# Patient Record
Sex: Male | Born: 1965 | Hispanic: Yes | Marital: Married | Smoking: Never smoker
Health system: Southern US, Community
[De-identification: ages and names within clinical notes are randomized; demographics above are authoritative.]

## PROBLEM LIST (undated history)

## (undated) DIAGNOSIS — I10 Essential (primary) hypertension: Secondary | ICD-10-CM

---

## 2018-12-17 ENCOUNTER — Emergency Department (HOSPITAL_COMMUNITY): Payer: Medicaid Other

## 2018-12-17 ENCOUNTER — Other Ambulatory Visit: Payer: Self-pay

## 2018-12-17 ENCOUNTER — Emergency Department (HOSPITAL_COMMUNITY)
Admission: EM | Admit: 2018-12-17 | Discharge: 2018-12-17 | Disposition: A | Discharge status: Home / Self Care | Payer: Medicaid Other | Attending: Emergency Medicine | Admitting: Emergency Medicine

## 2018-12-17 DIAGNOSIS — J22 Unspecified acute lower respiratory infection: Secondary | ICD-10-CM | POA: Insufficient documentation

## 2018-12-17 DIAGNOSIS — U071 COVID-19: Secondary | ICD-10-CM | POA: Diagnosis not present

## 2018-12-17 DIAGNOSIS — R0602 Shortness of breath: Secondary | ICD-10-CM | POA: Diagnosis present

## 2018-12-17 LAB — CBC WITH DIFFERENTIAL/PLATELET
Abs Immature Granulocytes: 0.04 10*3/uL (ref 0.00–0.07)
Basophils Absolute: 0 10*3/uL (ref 0.0–0.1)
Basophils Relative: 0 %
Eosinophils Absolute: 0 10*3/uL (ref 0.0–0.5)
Eosinophils Relative: 0 %
HCT: 49.2 % (ref 39.0–52.0)
Hemoglobin: 16.5 g/dL (ref 13.0–17.0)
Immature Granulocytes: 1 %
Lymphocytes Relative: 12 %
Lymphs Abs: 0.8 10*3/uL (ref 0.7–4.0)
MCH: 30.4 pg (ref 26.0–34.0)
MCHC: 33.5 g/dL (ref 30.0–36.0)
MCV: 90.8 fL (ref 80.0–100.0)
Monocytes Absolute: 0.8 10*3/uL (ref 0.1–1.0)
Monocytes Relative: 12 %
Neutro Abs: 5.3 10*3/uL (ref 1.7–7.7)
Neutrophils Relative %: 75 %
Platelets: 219 10*3/uL (ref 150–400)
RBC: 5.42 MIL/uL (ref 4.22–5.81)
RDW: 13.1 % (ref 11.5–15.5)
WBC: 7.1 10*3/uL (ref 4.0–10.5)
nRBC: 0 % (ref 0.0–0.2)

## 2018-12-17 LAB — COMPREHENSIVE METABOLIC PANEL
ALT: 32 U/L (ref 0–44)
AST: 39 U/L (ref 15–41)
Albumin: 3.4 g/dL — ABNORMAL LOW (ref 3.5–5.0)
Alkaline Phosphatase: 112 U/L (ref 38–126)
Anion gap: 10 (ref 5–15)
BUN: 15 mg/dL (ref 6–20)
CO2: 25 mmol/L (ref 22–32)
Calcium: 8.3 mg/dL — ABNORMAL LOW (ref 8.9–10.3)
Chloride: 98 mmol/L (ref 98–111)
Creatinine, Ser: 0.65 mg/dL (ref 0.61–1.24)
GFR calc Af Amer: >60 mL/min (ref >60)
GFR calc non Af Amer: >60 mL/min (ref >60)
Glucose, Bld: 159 mg/dL — ABNORMAL HIGH (ref 70–99)
Potassium: 3.8 mmol/L (ref 3.5–5.1)
Sodium: 133 mmol/L — ABNORMAL LOW (ref 135–145)
Total Bilirubin: 0.9 mg/dL (ref 0.3–1.2)
Total Protein: 7 g/dL (ref 6.5–8.1)

## 2018-12-17 LAB — SARS CORONAVIRUS 2 BY RT PCR (HOSPITAL ORDER, PERFORMED IN ~~LOC~~ HOSPITAL LAB): SARS Coronavirus 2: POSITIVE — AB

## 2018-12-17 LAB — FIBRINOGEN: Fibrinogen: 676 mg/dL — ABNORMAL HIGH (ref 210–475)

## 2018-12-17 LAB — LACTIC ACID, PLASMA: Lactic Acid, Venous: 1 mmol/L (ref 0.5–1.9)

## 2018-12-17 LAB — C-REACTIVE PROTEIN: CRP: 5.9 mg/dL — ABNORMAL HIGH (ref <1.0)

## 2018-12-17 MED ORDER — KETOROLAC TROMETHAMINE 30 MG/ML IJ SOLN
30.0000 mg | Freq: Once | INTRAMUSCULAR | Status: AC
  Administered 2018-12-17: 10:00:00 30 mg via INTRAVENOUS
  Filled 2018-12-17: qty 1

## 2018-12-17 MED ORDER — ONDANSETRON HCL 4 MG/2ML IJ SOLN
4.0000 mg | Freq: Once | INTRAMUSCULAR | Status: AC
  Administered 2018-12-17: 10:00:00 4 mg via INTRAVENOUS
  Filled 2018-12-17: qty 2

## 2018-12-17 MED ORDER — SODIUM CHLORIDE 0.9 % IV BOLUS
1000.0000 mL | Freq: Once | INTRAVENOUS | Status: AC
  Administered 2018-12-17: 1000 mL via INTRAVENOUS

## 2018-12-17 MED ORDER — ALBUTEROL SULFATE HFA 108 (90 BASE) MCG/ACT IN AERS
2.0000 | INHALATION_SPRAY | RESPIRATORY_TRACT | Status: DC
  Administered 2018-12-17: 10:00:00 2 via RESPIRATORY_TRACT
  Filled 2018-12-17: qty 6.7

## 2018-12-17 MED ORDER — LEVOFLOXACIN 500 MG PO TABS: 500.0000 mg | ORAL_TABLET | Freq: Every day | ORAL | 0 refills | Status: DC

## 2018-12-17 MED ORDER — LEVOFLOXACIN 500 MG PO TABS
500.0000 mg | ORAL_TABLET | Freq: Once | ORAL | Status: AC
  Administered 2018-12-17: 12:00:00 500 mg via ORAL
  Filled 2018-12-17: qty 1

## 2018-12-17 NOTE — ED Notes (Signed)
Pt ambulatory at discharge, provided with taxi voucher.

## 2018-12-17 NOTE — ED Provider Notes (Signed)
[facility]-EMERGENCY DEPT Provider Note   CSN: [id number] Arrival date & time: 12/17/18  0848    History   Chief Complaint No chief complaint on file.   HPI Cameron Hampton is a 53 y.o. male.     HPI Patient is a 53 year old male whose spouse is tested positive for COVID-19.  He states he is felt short of breath over the past several days with ongoing cough.  Fevers at home.  Headache.  Myalgias.  Diarrhea.  Denies nausea and vomiting.  No history of COPD or asthma.  No use of bronchodilators to this point.  Reports decreased oral intake.  He is tried ibuprofen without improvement in symptoms.  His cough is dry.  His wife is in the emergency department with similar symptoms despite testing positive over 1 week ago.  There is a 55 year old who lives with the patient as well but she is currently okay without upper respiratory symptoms. No past medical history on file.  There are no active problems to display for this patient.   * The histories are not reviewed yet. Please review them in the "History" navigator section and refresh this SmartLink.      Home Medications    Prior to Admission medications   Not on File    Family History No family history on file.  Social History Social History   Tobacco Use  . Smoking status: Not on file  Substance Use Topics  . Alcohol use: Not on file  . Drug use: Not on file     Allergies   Patient has no allergy information on record.   Review of Systems Review of Systems  All other systems reviewed and are negative.    Physical Exam Updated Vital Signs BP (!) 127/94 (BP Location: Left Arm)   Pulse 67   Temp 99.3 F (37.4 C) (Oral)   Resp 18   Ht 6' (1.829 m)   Wt 87.1 kg   SpO2 97%   BMI 26.04 kg/m   Physical Exam Vitals signs and nursing note reviewed.  Constitutional:      Appearance: Normal appearance. He is well-developed and normal weight. He is not ill-appearing, toxic-appearing or  diaphoretic.  HENT:     Head: Normocephalic and atraumatic.  Neck:     Musculoskeletal: Normal range of motion.  Cardiovascular:     Rate and Rhythm: Normal rate and regular rhythm.     Heart sounds: Normal heart sounds.  Pulmonary:     Effort: Pulmonary effort is normal. No respiratory distress.     Breath sounds: Normal breath sounds. No stridor. No wheezing or rhonchi.     Comments: No respiratory distress Abdominal:     General: There is no distension.     Palpations: Abdomen is soft.     Tenderness: There is no abdominal tenderness.  Musculoskeletal: Normal range of motion.  Skin:    General: Skin is warm and dry.  Neurological:     Mental Status: He is alert and oriented to person, place, and time.  Psychiatric:        Judgment: Judgment normal.      ED Treatments / Results  Labs (all labs ordered are listed, but only abnormal results are displayed) Labs Reviewed - No data to display  EKG None  Radiology No results found.  Procedures Procedures (including critical care time)  Medications Ordered in ED Medications - No data to display   Initial Impression / Assessment and Plan / ED  Course  I have reviewed the triage vital signs and the nursing notes.  Pertinent labs & imaging results that were available during my care of the patient were reviewed by me and considered in my medical decision making (see chart for details).       Symptomatic management here in the emergency department.  Patient was tested for coronavirus and he is positive for COVID-19.  He does have an infiltrate in the left lower lung.  He will be started on Levaquin.  He is at risk for worsening of his symptoms but he was observed in the emergency department for over 7 hours without decompensation.  He was ambulated throughout his room a total of approximately 50 feet without increased work of breathing or hypoxia.  At this time he is stable for discharge.  Hopefully he will improve at home  with symptomatic management.  He has been encouraged to return to the emergency department for new or worsening symptoms.  He understands the possibility of worsening of his symptoms given the clinical course of COVID-19.  All discharge instructions were provided to the patient using the Spanish interpreter.  He understands the importance of home quarantine and self isolation from his daughter who lives in the house with them as well as other family members.  He is not to be in public   Cameron Hampton was evaluated in Emergency Department on 12/17/2018 for the symptoms described in the history of present illness. He was evaluated in the context of the global COVID-19 pandemic, which necessitated consideration that the patient might be at risk for infection with the SARS-CoV-2 virus that causes COVID-19. Institutional protocols and algorithms that pertain to the evaluation of patients at risk for COVID-19 are in a state of rapid change based on information released by regulatory bodies including the CDC and federal and state organizations. These policies and algorithms were followed during the patient's care in the ED.   Final Clinical Impressions(s) / ED Diagnoses   Final diagnoses:  COVID-19 virus infection  Lower respiratory tract infection due to COVID-19 virus    ED Discharge Orders    None       Azalia Bilis, MD 12/17/18 1554

## 2018-12-17 NOTE — Progress Notes (Signed)
Voucher provided to RN.  Please reconsult if future social work needs arise.  CSW signing off, as social work intervention is no longer needed.  Dorothe Pea. Danahi Reddish, LCSW, LCAS, CSI Transitions of Care Clinical Social Worker Care Coordination Department Ph: [phone]

## 2018-12-17 NOTE — ED Notes (Signed)
Discharge paperwork reviewed with pt using [facility] interpreter.  Pt verbalized understanding of information and included prescriptions.  Pt expressed need for taxi voucher for transportation home.  SW notified.

## 2018-12-17 NOTE — ED Triage Notes (Signed)
Information obtained using [facility] interpreter.    Wife confirmed positive with Covid-19   Fever, headache, sore throat and chest pain d/t cough x10 days. Also c/o generalized weakness. Pt symptomatic fever, no thermometer at home. Pt reports being unable to eat, constant nausea.  Pt with dry cough. Pt reports taking ibuprofen, 2 capsules, at appx 0400 today.  Hx of hypertension.

## 2018-12-17 NOTE — Progress Notes (Signed)
CSW received a call from pt's RN, both pt and spouse are covid positive and need transport home.  CSW to provide taxi voucher.  CSW will continue to follow for D/C needs.  Cameron Hampton. [name], LCSW, LCAS, CSI Transitions of Care Clinical Social Worker Care Coordination Department Ph: [phone]

## 2018-12-17 NOTE — ED Notes (Signed)
Date and time results received: 12/17/18 12:35 PM   Test: Covid Critical Value: positive  Name of Provider Notified:campos  Orders Received? Or Actions Taken?:

## 2018-12-17 NOTE — ED Notes (Signed)
Pt able to independently ambulate in room, appx 50 feet.  Pt O2 saturations did not go below 94% during or after walking.  Pt c/o increased SHOB, labored breathing.

## 2018-12-21 ENCOUNTER — Inpatient Hospital Stay (HOSPITAL_COMMUNITY)
Admission: EM | Admit: 2018-12-21 | Discharge: 2018-12-31 | DRG: 177 | Disposition: A | Discharge status: Home / Self Care | Payer: Medicaid Other | Attending: Internal Medicine | Admitting: Internal Medicine

## 2018-12-21 ENCOUNTER — Other Ambulatory Visit: Payer: Self-pay

## 2018-12-21 ENCOUNTER — Emergency Department (HOSPITAL_COMMUNITY): Payer: Medicaid Other

## 2018-12-21 DIAGNOSIS — J029 Acute pharyngitis, unspecified: Secondary | ICD-10-CM | POA: Diagnosis present

## 2018-12-21 DIAGNOSIS — R109 Unspecified abdominal pain: Secondary | ICD-10-CM

## 2018-12-21 DIAGNOSIS — R0602 Shortness of breath: Secondary | ICD-10-CM

## 2018-12-21 DIAGNOSIS — E1165 Type 2 diabetes mellitus with hyperglycemia: Secondary | ICD-10-CM | POA: Diagnosis present

## 2018-12-21 DIAGNOSIS — K851 Biliary acute pancreatitis without necrosis or infection: Secondary | ICD-10-CM | POA: Diagnosis present

## 2018-12-21 DIAGNOSIS — R001 Bradycardia, unspecified: Secondary | ICD-10-CM | POA: Diagnosis not present

## 2018-12-21 DIAGNOSIS — T380X5A Adverse effect of glucocorticoids and synthetic analogues, initial encounter: Secondary | ICD-10-CM | POA: Diagnosis present

## 2018-12-21 DIAGNOSIS — I472 Ventricular tachycardia: Secondary | ICD-10-CM | POA: Diagnosis not present

## 2018-12-21 DIAGNOSIS — Q676 Pectus excavatum: Secondary | ICD-10-CM

## 2018-12-21 DIAGNOSIS — J1289 Other viral pneumonia: Secondary | ICD-10-CM | POA: Diagnosis present

## 2018-12-21 DIAGNOSIS — I10 Essential (primary) hypertension: Secondary | ICD-10-CM | POA: Diagnosis present

## 2018-12-21 DIAGNOSIS — J9601 Acute respiratory failure with hypoxia: Secondary | ICD-10-CM | POA: Diagnosis present

## 2018-12-21 DIAGNOSIS — R7989 Other specified abnormal findings of blood chemistry: Secondary | ICD-10-CM

## 2018-12-21 DIAGNOSIS — Z8249 Family history of ischemic heart disease and other diseases of the circulatory system: Secondary | ICD-10-CM

## 2018-12-21 DIAGNOSIS — U071 COVID-19: Principal | ICD-10-CM | POA: Diagnosis present

## 2018-12-21 DIAGNOSIS — J069 Acute upper respiratory infection, unspecified: Secondary | ICD-10-CM | POA: Diagnosis present

## 2018-12-21 DIAGNOSIS — I712 Thoracic aortic aneurysm, without rupture: Secondary | ICD-10-CM | POA: Diagnosis present

## 2018-12-21 HISTORY — DX: Essential (primary) hypertension: I10

## 2018-12-21 LAB — LACTATE DEHYDROGENASE: LDH: 352 U/L — ABNORMAL HIGH (ref 98–192)

## 2018-12-21 LAB — LIPASE, BLOOD: Lipase: 37 U/L (ref 11–51)

## 2018-12-21 LAB — CBC WITH DIFFERENTIAL/PLATELET
Abs Immature Granulocytes: 0.05 10*3/uL (ref 0.00–0.07)
Basophils Absolute: 0 10*3/uL (ref 0.0–0.1)
Basophils Relative: 0 %
Eosinophils Absolute: 0 10*3/uL (ref 0.0–0.5)
Eosinophils Relative: 0 %
HCT: 47.8 % (ref 39.0–52.0)
Hemoglobin: 16.1 g/dL (ref 13.0–17.0)
Immature Granulocytes: 1 %
Lymphocytes Relative: 7 %
Lymphs Abs: 0.7 10*3/uL (ref 0.7–4.0)
MCH: 30.1 pg (ref 26.0–34.0)
MCHC: 33.7 g/dL (ref 30.0–36.0)
MCV: 89.3 fL (ref 80.0–100.0)
Monocytes Absolute: 0.8 10*3/uL (ref 0.1–1.0)
Monocytes Relative: 8 %
Neutro Abs: 8.6 10*3/uL — ABNORMAL HIGH (ref 1.7–7.7)
Neutrophils Relative %: 84 %
Platelets: 396 10*3/uL (ref 150–400)
RBC: 5.35 MIL/uL (ref 4.22–5.81)
RDW: 13.1 % (ref 11.5–15.5)
WBC: 10.2 10*3/uL (ref 4.0–10.5)
nRBC: 0 % (ref 0.0–0.2)

## 2018-12-21 LAB — LACTIC ACID, PLASMA: Lactic Acid, Venous: 1 mmol/L (ref 0.5–1.9)

## 2018-12-21 LAB — FIBRINOGEN: Fibrinogen: >800 mg/dL — ABNORMAL HIGH (ref 210–475)

## 2018-12-21 LAB — COMPREHENSIVE METABOLIC PANEL
ALT: 50 U/L — ABNORMAL HIGH (ref 0–44)
AST: 53 U/L — ABNORMAL HIGH (ref 15–41)
Albumin: 2.8 g/dL — ABNORMAL LOW (ref 3.5–5.0)
Alkaline Phosphatase: 166 U/L — ABNORMAL HIGH (ref 38–126)
Anion gap: 10 (ref 5–15)
BUN: 11 mg/dL (ref 6–20)
CO2: 25 mmol/L (ref 22–32)
Calcium: 8.3 mg/dL — ABNORMAL LOW (ref 8.9–10.3)
Chloride: 98 mmol/L (ref 98–111)
Creatinine, Ser: 0.54 mg/dL — ABNORMAL LOW (ref 0.61–1.24)
GFR calc Af Amer: >60 mL/min (ref >60)
GFR calc non Af Amer: >60 mL/min (ref >60)
Glucose, Bld: 141 mg/dL — ABNORMAL HIGH (ref 70–99)
Potassium: 3.9 mmol/L (ref 3.5–5.1)
Sodium: 133 mmol/L — ABNORMAL LOW (ref 135–145)
Total Bilirubin: 0.6 mg/dL (ref 0.3–1.2)
Total Protein: 7 g/dL (ref 6.5–8.1)

## 2018-12-21 LAB — TRIGLYCERIDES: Triglycerides: 132 mg/dL (ref <150)

## 2018-12-21 LAB — D-DIMER, QUANTITATIVE: D-Dimer, Quant: 1.04 ug/mL-FEU — ABNORMAL HIGH (ref 0.00–0.50)

## 2018-12-21 LAB — PROCALCITONIN: Procalcitonin: 0.17 ng/mL

## 2018-12-21 LAB — C-REACTIVE PROTEIN: CRP: 19.6 mg/dL — ABNORMAL HIGH (ref <1.0)

## 2018-12-21 MED ORDER — MORPHINE SULFATE (PF) 2 MG/ML IV SOLN
2.0000 mg | Freq: Once | INTRAVENOUS | Status: AC
  Administered 2018-12-21: 22:00:00 2 mg via INTRAVENOUS
  Filled 2018-12-21: qty 1

## 2018-12-21 MED ORDER — DIPHENHYDRAMINE HCL 50 MG/ML IJ SOLN
25.0000 mg | Freq: Once | INTRAMUSCULAR | Status: AC
  Administered 2018-12-21: 22:00:00 25 mg via INTRAVENOUS
  Filled 2018-12-21: qty 1

## 2018-12-21 MED ORDER — SODIUM CHLORIDE 0.9 % IV BOLUS
1000.0000 mL | Freq: Once | INTRAVENOUS | Status: AC
  Administered 2018-12-21: 22:00:00 1000 mL via INTRAVENOUS

## 2018-12-21 MED ORDER — PROCHLORPERAZINE EDISYLATE 10 MG/2ML IJ SOLN
10.0000 mg | Freq: Once | INTRAMUSCULAR | Status: AC
  Administered 2018-12-21: 22:00:00 10 mg via INTRAVENOUS
  Filled 2018-12-21: qty 2

## 2018-12-21 NOTE — ED Provider Notes (Signed)
Nursing notes and vitals signs, including pulse oximetry, reviewed.  Summary of this visit's results, reviewed by myself:  EKG:  EKG Interpretation  Date/Time:  Wednesday Dec 21 2018 20:43:39 EDT Ventricular Rate:  89 PR Interval:    QRS Duration: 89 QT Interval:  361 QTC Calculation: 440 R Axis:   -52 Text Interpretation:  Sinus rhythm Probable left atrial enlargement Left anterior fascicular block Abnormal R-wave progression, late transition Left ventricular hypertrophy No significant change since last tracing Confirmed by Melene Plan [phone]) on 12/21/2018 9:02:31 PM       Labs:  Results for orders placed or performed during the hospital encounter of 12/21/18 (from the past 24 hour(s))  Lactic acid, plasma     Status: None   Collection Time: 12/21/18 10:36 PM  Result Value Ref Range   Lactic Acid, Venous 1.0 0.5 - 1.9 mmol/L  Blood Culture (routine x 2)     Status: None (Preliminary result)   Collection Time: 12/21/18 10:36 PM  Result Value Ref Range   Specimen Description      BLOOD RIGHT FOREARM Performed at [facility], [address] [place], [place] [address]    Special Requests      BOTTLES DRAWN AEROBIC AND ANAEROBIC Blood Culture adequate volume Performed at [facility], [address], [place], [place] [address]    Culture PENDING    Report Status PENDING   Blood Culture (routine x 2)     Status: None (Preliminary result)   Collection Time: 12/21/18 10:36 PM  Result Value Ref Range   Specimen Description      BLOOD LEFT FOREARM Performed at [facility], [address] [place], [place] [address]    Special Requests      BOTTLES DRAWN AEROBIC AND ANAEROBIC Blood Culture adequate volume Performed at [facility], [address] [place], [place] [address]    Culture PENDING    Report Status PENDING   CBC WITH DIFFERENTIAL     Status: Abnormal   Collection Time: 12/21/18 10:36 PM  Result Value Ref Range   WBC 10.2 4.0 - 10.5 K/uL   RBC 5.35 4.22 - 5.81 MIL/uL   Hemoglobin 16.1 13.0 - 17.0 g/dL   HCT 72.0 94.7 - 09.6 %   MCV 89.3 80.0 - 100.0 fL   MCH 30.1 26.0 - 34.0 pg   MCHC 33.7 30.0 - 36.0 g/dL   RDW 28.3 66.2 - 94.7 %   Platelets 396 150 - 400 K/uL   nRBC 0.0 0.0 - 0.2 %   Neutrophils Relative % 84 %   Neutro Abs 8.6 (H) 1.7 - 7.7 K/uL   Lymphocytes Relative 7 %   Lymphs Abs 0.7 0.7 - 4.0 K/uL   Monocytes Relative 8 %   Monocytes Absolute 0.8 0.1 - 1.0 K/uL   Eosinophils Relative 0 %   Eosinophils Absolute 0.0 0.0 - 0.5 K/uL   Basophils Relative 0 %   Basophils Absolute 0.0 0.0 - 0.1 K/uL   Immature Granulocytes 1 %   Abs Immature Granulocytes 0.05 0.00 - 0.07 K/uL  Comprehensive metabolic panel     Status: Abnormal   Collection Time: 12/21/18 10:36 PM  Result Value Ref Range   Sodium 133 (L) 135 - 145 mmol/L   Potassium 3.9 3.5 - 5.1 mmol/L   Chloride 98 98 - 111 mmol/L   CO2 25 22 - 32 mmol/L   Glucose, Bld 141 (H) 70 - 99 mg/dL  BUN 11 6 - 20 mg/dL   Creatinine, Ser 1.610.54 (L) 0.61 - 1.24 mg/dL   Calcium 8.3 (L) 8.9 - 10.3 mg/dL   Total Protein 7.0 6.5 - 8.1 g/dL   Albumin 2.8 (L) 3.5 - 5.0 g/dL   AST 53 (H) 15 - 41 U/L   ALT 50 (H) 0 - 44 U/L   Alkaline Phosphatase 166 (H) 38 - 126 U/L   Total Bilirubin 0.6 0.3 - 1.2 mg/dL   GFR calc non Af Amer >60 >60 mL/min   GFR calc Af Amer >60 >60 mL/min   Anion gap 10 5 - 15  D-dimer, quantitative     Status: Abnormal   Collection Time: 12/21/18 10:36 PM  Result Value Ref Range   D-Dimer, Quant 1.04 (H) 0.00 - 0.50 ug/mL-FEU  Procalcitonin     Status: None   Collection Time: 12/21/18 10:36 PM  Result Value Ref Range   Procalcitonin 0.17 ng/mL  Lactate dehydrogenase     Status: Abnormal   Collection Time: 12/21/18 10:36 PM  Result Value Ref Range   LDH 352 (H) 98 - 192 U/L  Triglycerides     Status: None   Collection Time: 12/21/18 10:36 PM  Result Value Ref Range   Triglycerides 132 <150 mg/dL  Fibrinogen      Status: Abnormal   Collection Time: 12/21/18 10:36 PM  Result Value Ref Range   Fibrinogen >800 (H) 210 - 475 mg/dL  C-reactive protein     Status: Abnormal   Collection Time: 12/21/18 10:36 PM  Result Value Ref Range   CRP 19.6 (H) <1.0 mg/dL  Lipase, blood     Status: None   Collection Time: 12/21/18 10:36 PM  Result Value Ref Range   Lipase 37 11 - 51 U/L    Imaging Studies: Dg Chest Port 1 View  Result Date: 12/21/2018 CLINICAL DATA:  Shortness of breath and cough EXAM: PORTABLE CHEST 1 VIEW COMPARISON:  12/17/2018 FINDINGS: Bibasilar ground-glass opacities. These are increased as compared with 12/17/2018. Stable cardiomediastinal silhouette. No pneumothorax. IMPRESSION: Worsening bibasilar ground-glass opacity/pneumonias. Electronically Signed   By: Jasmine PangKim  Fujinaga M.D.   On: 12/21/2018 21:11   11:54 PM Will have patient admitted for COVID-19 infection with worsening infiltrates and hypoxia.     Raistlin Gum, Jonny RuizJohn, MD 12/21/18 2355

## 2018-12-21 NOTE — ED Provider Notes (Signed)
[facility]-EMERGENCY DEPT Provider Note   CSN: [id number] Arrival date & time: 12/21/18  2019    History   Chief Complaint Chief Complaint  Patient presents with   Nausea   Emesis   Diarrhea    HPI Cameron Hampton is a 53 y.o. male.     53 yo M with a chief complaints of nausea vomiting diarrhea diffuse muscle aches fever and shortness of breath.  This been going on for the past couple days.  Patient has not been able to sleep for about 8 days now due to his illness.  Was seen in the ED about 4 days ago and found to have novel coronavirus.  At that point he was not hypoxic and was able to ambulate without significant shortness of breath.  Per EMS he was in the 80s on arrival and was very tachypneic and required nonrebreather.  The history is provided by the patient.  Emesis  Associated symptoms: abdominal pain, chills, cough, diarrhea, fever and headaches   Associated symptoms: no arthralgias and no myalgias   Diarrhea  Associated symptoms: abdominal pain, chills, fever, headaches and vomiting   Associated symptoms: no arthralgias and no myalgias   Illness  Severity:  Moderate Onset quality:  Sudden Duration:  1 week Timing:  Constant Progression:  Worsening Chronicity:  New Associated symptoms: abdominal pain, cough, diarrhea, fever, headaches, shortness of breath and vomiting   Associated symptoms: no chest pain, no congestion, no myalgias and no rash     Past Medical History:  Diagnosis Date   Hypertension     Patient Active Problem List   Diagnosis Date Noted   Acute respiratory disease due to COVID-19 virus 12/22/2018   Essential hypertension 12/22/2018    History reviewed. No pertinent surgical history.      Home Medications    Prior to Admission medications   Medication Sig Start Date End Date Taking? Authorizing Provider  ibuprofen (ADVIL) 200 MG tablet Take 400 mg by mouth every 6 (six) hours as needed for moderate  pain.   Yes [provider]  levofloxacin (LEVAQUIN) 500 MG tablet Take 1 tablet (500 mg total) by mouth daily. 12/17/18  Yes Azalia Bilis, MD  propranolol (INDERAL) 40 MG tablet Take 40 mg by mouth daily.   Yes [provider]    Family History Family History  Problem Relation Age of Onset   Hypertension Other     Social History Social History   Tobacco Use   Smoking status: Never Smoker   Smokeless tobacco: Never Used  Substance Use Topics   Alcohol use: Not Currently   Drug use: Not on file     Allergies   Patient has no known allergies.   Review of Systems Review of Systems  Constitutional: Positive for chills and fever.  HENT: Negative for congestion and facial swelling.   Eyes: Negative for discharge and visual disturbance.  Respiratory: Positive for cough and shortness of breath.   Cardiovascular: Negative for chest pain and palpitations.  Gastrointestinal: Positive for abdominal pain, diarrhea and vomiting.  Musculoskeletal: Negative for arthralgias and myalgias.  Skin: Negative for color change and rash.  Neurological: Positive for headaches. Negative for tremors and syncope.  Psychiatric/Behavioral: Negative for confusion and dysphoric mood.     Physical Exam Updated Vital Signs BP 117/88 (BP Location: Left Arm)    Pulse 75    Temp 99 F (37.2 C) (Oral)    Resp (!) 23    Ht 6' (1.829  m)    Wt 78.7 kg    SpO2 94%    BMI 23.53 kg/m   Physical Exam Vitals signs and nursing note reviewed.  Constitutional:      Appearance: He is well-developed.  HENT:     Head: Normocephalic and atraumatic.  Eyes:     Pupils: Pupils are equal, round, and reactive to light.  Neck:     Musculoskeletal: Normal range of motion and neck supple.     Vascular: No JVD.  Cardiovascular:     Rate and Rhythm: Normal rate and regular rhythm.     Heart sounds: No murmur. No friction rub. No gallop.   Pulmonary:     Effort: No respiratory distress.      Breath sounds: No wheezing.     Comments: Diminished in all fields Abdominal:     General: There is no distension.     Tenderness: There is no abdominal tenderness. There is no guarding or rebound.  Musculoskeletal: Normal range of motion.  Skin:    Coloration: Skin is not pale.     Findings: No rash.  Neurological:     Mental Status: He is alert and oriented to person, place, and time.  Psychiatric:        Behavior: Behavior normal.      ED Treatments / Results  Labs (all labs ordered are listed, but only abnormal results are displayed) Labs Reviewed  CBC WITH DIFFERENTIAL/PLATELET - Abnormal; Notable for the following components:      Result Value   Neutro Abs 8.6 (*)    All other components within normal limits  COMPREHENSIVE METABOLIC PANEL - Abnormal; Notable for the following components:   Sodium 133 (*)    Glucose, Bld 141 (*)    Creatinine, Ser 0.54 (*)    Calcium 8.3 (*)    Albumin 2.8 (*)    AST 53 (*)    ALT 50 (*)    Alkaline Phosphatase 166 (*)    All other components within normal limits  D-DIMER, QUANTITATIVE (NOT AT [facility]) - Abnormal; Notable for the following components:   D-Dimer, Quant 1.04 (*)    All other components within normal limits  LACTATE DEHYDROGENASE - Abnormal; Notable for the following components:   LDH 352 (*)    All other components within normal limits  FERRITIN - Abnormal; Notable for the following components:   Ferritin 2,117 (*)    All other components within normal limits  FIBRINOGEN - Abnormal; Notable for the following components:   Fibrinogen >800 (*)    All other components within normal limits  C-REACTIVE PROTEIN - Abnormal; Notable for the following components:   CRP 19.6 (*)    All other components within normal limits  SEDIMENTATION RATE - Abnormal; Notable for the following components:   Sed Rate 62 (*)    All other components within normal limits  C-REACTIVE PROTEIN - Abnormal; Notable for the following components:     CRP 18.3 (*)    All other components within normal limits  COMPREHENSIVE METABOLIC PANEL - Abnormal; Notable for the following components:   Glucose, Bld 116 (*)    Calcium 8.1 (*)    Albumin 2.8 (*)    AST 44 (*)    Alkaline Phosphatase 151 (*)    All other components within normal limits  FERRITIN - Abnormal; Notable for the following components:   Ferritin 1,890 (*)    All other components within normal limits  CULTURE, BLOOD (ROUTINE X 2)  CULTURE, BLOOD (ROUTINE X 2)  EXPECTORATED SPUTUM ASSESSMENT W REFEX TO RESP CULTURE  GROUP A STREP BY PCR  LACTIC ACID, PLASMA  PROCALCITONIN  TRIGLYCERIDES  LIPASE, BLOOD  HIV ANTIBODY (ROUTINE TESTING W REFLEX)  STREP PNEUMONIAE URINARY ANTIGEN  MAGNESIUM  CBC WITH DIFFERENTIAL/PLATELET    EKG EKG Interpretation  Date/Time:  Wednesday Dec 21 2018 20:43:39 EDT Ventricular Rate:  89 PR Interval:    QRS Duration: 89 QT Interval:  361 QTC Calculation: 440 R Axis:   -52 Text Interpretation:  Sinus rhythm Probable left atrial enlargement Left anterior fascicular block Abnormal R-wave progression, late transition Left ventricular hypertrophy No significant change since last tracing Confirmed by Melene Plan [phone]) on 12/21/2018 9:02:31 PM Also confirmed by Melene Plan [phone]), editor Barbette Hair [phone])  on 12/22/2018 7:45:51 AM   Radiology Ct Angio Chest Pe W Or Wo Contrast  Result Date: 12/22/2018 CLINICAL DATA:  Positive D-dimer.  Hypoxemia.  Positive for COVID-19 EXAM: CT ANGIOGRAPHY CHEST WITH CONTRAST TECHNIQUE: Multidetector CT imaging of the chest was performed using the standard protocol during bolus administration of intravenous contrast. Multiplanar CT image reconstructions and MIPs were obtained to evaluate the vascular anatomy. CONTRAST:  OMNIPAQUE IOHEXOL 350 MG/ML SOLN COMPARISON:  Chest x-ray 12/21/2018 FINDINGS: Cardiovascular: Heart is mildly enlarged. Mild aneurysmal dilatation of the ascending thoracic aorta, 4.1  cm. No filling defects in the pulmonary arteries to suggest pulmonary emboli. Mediastinum/Nodes: No mediastinal, hilar, or axillary adenopathy. Lungs/Pleura: Patchy ground-glass opacities throughout both lungs, predominantly dependent but also noted in the apices. No effusions. This likely related to COVID-19. Upper Abdomen: Imaging into the upper abdomen shows no acute findings. Musculoskeletal: Chest wall soft tissues are unremarkable. No acute bony abnormality. Review of the MIP images confirms the above findings. IMPRESSION: Extensive patchy bilateral ground-glass airspace opacities, likely related to patient's known COVID-19. No evidence of pulmonary embolus. 4.1 cm ascending thoracic aortic aneurysm. Recommend annual imaging followup by CTA or MRA. This recommendation follows 2010 ACCF/AHA/AATS/ACR/ASA/SCA/SCAI/SIR/STS/SVM Guidelines for the Diagnosis and Management of Patients with Thoracic Aortic Disease. Circulation. 2010; 121: O130-Q657. Aortic aneurysm NOS (ICD10-I71.9) Electronically Signed   By: Charlett Nose M.D.   On: 12/22/2018 02:42   Dg Chest Port 1 View  Result Date: 12/21/2018 CLINICAL DATA:  Shortness of breath and cough EXAM: PORTABLE CHEST 1 VIEW COMPARISON:  12/17/2018 FINDINGS: Bibasilar ground-glass opacities. These are increased as compared with 12/17/2018. Stable cardiomediastinal silhouette. No pneumothorax. IMPRESSION: Worsening bibasilar ground-glass opacity/pneumonias. Electronically Signed   By: Jasmine Pang M.D.   On: 12/21/2018 21:11    Procedures Procedures (including critical care time)  Medications Ordered in ED Medications  propranolol (INDERAL) tablet 40 mg (40 mg Oral Given 12/22/18 1031)  sodium chloride flush (NS) 0.9 % injection 3 mL (3 mLs Intravenous Given 12/22/18 1019)  sodium chloride flush (NS) 0.9 % injection 3 mL (3 mLs Intravenous Given 12/22/18 1018)  sodium chloride flush (NS) 0.9 % injection 3 mL (has no administration in time range)  0.9 %  sodium  chloride infusion (has no administration in time range)  vitamin C (ASCORBIC ACID) tablet 500 mg (500 mg Oral Given 12/22/18 1018)  zinc sulfate capsule 220 mg (220 mg Oral Given 12/22/18 1018)  acetaminophen (TYLENOL) tablet 650 mg (has no administration in time range)  HYDROcodone-acetaminophen (NORCO/VICODIN) 5-325 MG per tablet 1-2 tablet (2 tablets Oral Given 12/22/18 0320)  ondansetron (ZOFRAN) tablet 4 mg (has no administration in time range)    Or  ondansetron (ZOFRAN) injection 4  mg (has no administration in time range)  guaiFENesin-dextromethorphan (ROBITUSSIN DM) 100-10 MG/5ML syrup 10 mL (has no administration in time range)  sodium chloride (PF) 0.9 % injection (  Not Given 12/22/18 0733)  methylPREDNISolone sodium succinate (SOLU-MEDROL) 125 mg/2 mL injection 80 mg (80 mg Intravenous Given 12/22/18 0939)  enoxaparin (LOVENOX) injection 40 mg (40 mg Subcutaneous Given 12/22/18 1018)  chlorhexidine (PERIDEX) 0.12 % solution 15 mL (15 mLs Mouth Rinse Given 12/22/18 1033)  MEDLINE mouth rinse (15 mLs Mouth Rinse Given 12/22/18 1130)  sodium chloride 0.9 % bolus 1,000 mL (0 mLs Intravenous Stopped 12/22/18 0005)  prochlorperazine (COMPAZINE) injection 10 mg (10 mg Intravenous Given 12/21/18 2217)  diphenhydrAMINE (BENADRYL) injection 25 mg (25 mg Intravenous Given 12/21/18 2217)  morphine 2 MG/ML injection 2 mg (2 mg Intravenous Given 12/21/18 2216)  iohexol (OMNIPAQUE) 350 MG/ML injection 100 mL (100 mLs Intravenous Contrast Given 12/22/18 0151)  tocilizumab (ACTEMRA) 600 mg in sodium chloride 0.9 % 100 mL infusion (0 mg Intravenous Stopped 12/22/18 1043)     Initial Impression / Assessment and Plan / ED Course  I have reviewed the triage vital signs and the nursing notes.  Pertinent labs & imaging results that were available during my care of the patient were reviewed by me and considered in my medical decision making (see chart for details).        53 yo M with a chief complaint of nausea vomiting  diarrhea headache and shortness of breath.  Patient was diagnosed with the novel coronavirus about 4 days ago and has had some worsening at home.  His chief complaint was actually nausea vomiting diarrhea and he was found to be hypoxic by EMS.  He has no appreciable abdominal pain on my exam.  Will obtain CBC CMP lipase with his hypoxia will likely need to come into the hospital.  Give a bolus of fluids treat his headache with Compazine and Benadryl and give a small dose of morphine.  Signed out to Dr. Read DriversMolpus awaiting labwork.  Xray viewed by me with worsening infiltrates already on levaquin.  Will hold on abx until discussion with medicine.   CRITICAL CARE Performed by: Rae Roamaniel Patrick Maylen Waltermire   Total critical care time: 35 minutes  Critical care time was exclusive of separately billable procedures and treating other patients.  Critical care was necessary to treat or prevent imminent or life-threatening deterioration.  Critical care was time spent personally by me on the following activities: development of treatment plan with patient and/or surrogate as well as nursing, discussions with consultants, evaluation of patient's response to treatment, examination of patient, obtaining history from patient or surrogate, ordering and performing treatments and interventions, ordering and review of laboratory studies, ordering and review of radiographic studies, pulse oximetry and re-evaluation of patient's condition.   Final Clinical Impressions(s) / ED Diagnoses   Final diagnoses:  COVID-19 virus infection    ED Discharge Orders    None       Melene PlanFloyd, Ciel Yanes, DO 12/22/18 1512

## 2018-12-21 NOTE — ED Triage Notes (Signed)
Patient presents with nausea, vomiting and diarrhea for 2 days. He was tested for Covid-19 2 weeks ago and was positive.   EMS vitals: 140/90 BR 80 HR  20 Resp Rate 97% O2 sats on 15L Non-Rebreather 180 CBG

## 2018-12-21 NOTE — ED Notes (Signed)
Bed: LT53 Expected date:  Expected time:  Means of arrival:  Comments: 78M COVID+ SOB

## 2018-12-22 ENCOUNTER — Other Ambulatory Visit: Payer: Self-pay

## 2018-12-22 ENCOUNTER — Inpatient Hospital Stay (HOSPITAL_COMMUNITY): Payer: Medicaid Other

## 2018-12-22 ENCOUNTER — Encounter (HOSPITAL_COMMUNITY): Payer: Self-pay | Admitting: Internal Medicine

## 2018-12-22 DIAGNOSIS — T380X5A Adverse effect of glucocorticoids and synthetic analogues, initial encounter: Secondary | ICD-10-CM | POA: Diagnosis not present

## 2018-12-22 DIAGNOSIS — I472 Ventricular tachycardia: Secondary | ICD-10-CM | POA: Diagnosis not present

## 2018-12-22 DIAGNOSIS — I712 Thoracic aortic aneurysm, without rupture: Secondary | ICD-10-CM | POA: Diagnosis not present

## 2018-12-22 DIAGNOSIS — U071 COVID-19: Secondary | ICD-10-CM | POA: Diagnosis present

## 2018-12-22 DIAGNOSIS — E1165 Type 2 diabetes mellitus with hyperglycemia: Secondary | ICD-10-CM | POA: Diagnosis not present

## 2018-12-22 DIAGNOSIS — J069 Acute upper respiratory infection, unspecified: Secondary | ICD-10-CM | POA: Diagnosis not present

## 2018-12-22 DIAGNOSIS — R001 Bradycardia, unspecified: Secondary | ICD-10-CM | POA: Diagnosis not present

## 2018-12-22 DIAGNOSIS — Z8249 Family history of ischemic heart disease and other diseases of the circulatory system: Secondary | ICD-10-CM | POA: Diagnosis not present

## 2018-12-22 DIAGNOSIS — J029 Acute pharyngitis, unspecified: Secondary | ICD-10-CM | POA: Diagnosis not present

## 2018-12-22 DIAGNOSIS — K851 Biliary acute pancreatitis without necrosis or infection: Secondary | ICD-10-CM | POA: Diagnosis not present

## 2018-12-22 DIAGNOSIS — J9601 Acute respiratory failure with hypoxia: Secondary | ICD-10-CM | POA: Diagnosis not present

## 2018-12-22 DIAGNOSIS — Q676 Pectus excavatum: Secondary | ICD-10-CM | POA: Diagnosis not present

## 2018-12-22 DIAGNOSIS — I10 Essential (primary) hypertension: Secondary | ICD-10-CM

## 2018-12-22 DIAGNOSIS — J1289 Other viral pneumonia: Secondary | ICD-10-CM | POA: Diagnosis not present

## 2018-12-22 LAB — CBC WITH DIFFERENTIAL/PLATELET
Abs Immature Granulocytes: 0.06 10*3/uL (ref 0.00–0.07)
Basophils Absolute: 0 10*3/uL (ref 0.0–0.1)
Basophils Relative: 0 %
Eosinophils Absolute: 0 10*3/uL (ref 0.0–0.5)
Eosinophils Relative: 0 %
HCT: 44.9 % (ref 39.0–52.0)
Hemoglobin: 14.9 g/dL (ref 13.0–17.0)
Immature Granulocytes: 1 %
Lymphocytes Relative: 9 %
Lymphs Abs: 0.9 10*3/uL (ref 0.7–4.0)
MCH: 30 pg (ref 26.0–34.0)
MCHC: 33.2 g/dL (ref 30.0–36.0)
MCV: 90.3 fL (ref 80.0–100.0)
Monocytes Absolute: 0.8 10*3/uL (ref 0.1–1.0)
Monocytes Relative: 9 %
Neutro Abs: 7.7 10*3/uL (ref 1.7–7.7)
Neutrophils Relative %: 81 %
Platelets: 400 10*3/uL (ref 150–400)
RBC: 4.97 MIL/uL (ref 4.22–5.81)
RDW: 13.2 % (ref 11.5–15.5)
WBC: 9.5 10*3/uL (ref 4.0–10.5)
nRBC: 0 % (ref 0.0–0.2)

## 2018-12-22 LAB — COMPREHENSIVE METABOLIC PANEL
ALT: 43 U/L (ref 0–44)
AST: 44 U/L — ABNORMAL HIGH (ref 15–41)
Albumin: 2.8 g/dL — ABNORMAL LOW (ref 3.5–5.0)
Alkaline Phosphatase: 151 U/L — ABNORMAL HIGH (ref 38–126)
Anion gap: 9 (ref 5–15)
BUN: 13 mg/dL (ref 6–20)
CO2: 26 mmol/L (ref 22–32)
Calcium: 8.1 mg/dL — ABNORMAL LOW (ref 8.9–10.3)
Chloride: 102 mmol/L (ref 98–111)
Creatinine, Ser: 0.64 mg/dL (ref 0.61–1.24)
GFR calc Af Amer: >60 mL/min (ref >60)
GFR calc non Af Amer: >60 mL/min (ref >60)
Glucose, Bld: 116 mg/dL — ABNORMAL HIGH (ref 70–99)
Potassium: 3.8 mmol/L (ref 3.5–5.1)
Sodium: 137 mmol/L (ref 135–145)
Total Bilirubin: 0.8 mg/dL (ref 0.3–1.2)
Total Protein: 6.6 g/dL (ref 6.5–8.1)

## 2018-12-22 LAB — CULTURE, BLOOD (ROUTINE X 2)
Culture: NO GROWTH
Culture: NO GROWTH
Special Requests: ADEQUATE

## 2018-12-22 LAB — C-REACTIVE PROTEIN: CRP: 18.3 mg/dL — ABNORMAL HIGH (ref <1.0)

## 2018-12-22 LAB — MAGNESIUM: Magnesium: 2.1 mg/dL (ref 1.7–2.4)

## 2018-12-22 LAB — HIV ANTIBODY (ROUTINE TESTING W REFLEX): HIV Screen 4th Generation wRfx: NONREACTIVE

## 2018-12-22 LAB — FERRITIN
Ferritin: 2117 ng/mL — ABNORMAL HIGH (ref 24–336)
Ferritin: 1890 ng/mL — ABNORMAL HIGH (ref 24–336)

## 2018-12-22 LAB — SEDIMENTATION RATE: Sed Rate: 62 mm/hr — ABNORMAL HIGH (ref 0–16)

## 2018-12-22 LAB — STREP PNEUMONIAE URINARY ANTIGEN: Strep Pneumo Urinary Antigen: NEGATIVE

## 2018-12-22 MED ORDER — ONDANSETRON HCL 4 MG/2ML IJ SOLN: 4.0000 mg | Freq: Four times a day (QID) | INTRAMUSCULAR | Status: DC | PRN

## 2018-12-22 MED ORDER — IOHEXOL 350 MG/ML SOLN
100.0000 mL | Freq: Once | INTRAVENOUS | Status: AC | PRN
  Administered 2018-12-22: 02:00:00 100 mL via INTRAVENOUS

## 2018-12-22 MED ORDER — ORAL CARE MOUTH RINSE
15.0000 mL | Freq: Two times a day (BID) | OROMUCOSAL | Status: DC
  Administered 2018-12-22 – 2018-12-30 (×14): 15 mL via OROMUCOSAL

## 2018-12-22 MED ORDER — TOCILIZUMAB 400 MG/20ML IV SOLN
600.0000 mg | Freq: Once | INTRAVENOUS | Status: AC
  Administered 2018-12-22: 10:00:00 600 mg via INTRAVENOUS
  Filled 2018-12-22: qty 10

## 2018-12-22 MED ORDER — CHLORHEXIDINE GLUCONATE 0.12 % MT SOLN
15.0000 mL | Freq: Two times a day (BID) | OROMUCOSAL | Status: DC
  Administered 2018-12-22 – 2018-12-31 (×19): 15 mL via OROMUCOSAL
  Filled 2018-12-22 (×17): qty 15

## 2018-12-22 MED ORDER — SODIUM CHLORIDE 0.9 % IV SOLN
1.0000 g | Freq: Every day | INTRAVENOUS | Status: DC
  Administered 2018-12-22: 03:00:00 1 g via INTRAVENOUS
  Filled 2018-12-22: qty 10

## 2018-12-22 MED ORDER — ONDANSETRON HCL 4 MG PO TABS: 4.0000 mg | ORAL_TABLET | Freq: Four times a day (QID) | ORAL | Status: DC | PRN

## 2018-12-22 MED ORDER — HYDROCODONE-ACETAMINOPHEN 5-325 MG PO TABS
1.0000 | ORAL_TABLET | ORAL | Status: DC | PRN
  Administered 2018-12-22 – 2018-12-25 (×2): 2 via ORAL
  Administered 2018-12-25: 1 via ORAL
  Filled 2018-12-22: qty 1
  Filled 2018-12-22 (×2): qty 2

## 2018-12-22 MED ORDER — SODIUM CHLORIDE 0.9% FLUSH
3.0000 mL | Freq: Two times a day (BID) | INTRAVENOUS | Status: DC
  Administered 2018-12-22 – 2018-12-30 (×19): 3 mL via INTRAVENOUS

## 2018-12-22 MED ORDER — PROPRANOLOL HCL 40 MG PO TABS
40.0000 mg | ORAL_TABLET | Freq: Every day | ORAL | Status: DC
  Administered 2018-12-22 – 2018-12-25 (×4): 40 mg via ORAL
  Filled 2018-12-22 (×5): qty 1

## 2018-12-22 MED ORDER — GUAIFENESIN-DM 100-10 MG/5ML PO SYRP
10.0000 mL | ORAL_SOLUTION | ORAL | Status: DC | PRN
  Administered 2018-12-23 – 2018-12-30 (×6): 10 mL via ORAL
  Filled 2018-12-22 (×6): qty 10

## 2018-12-22 MED ORDER — SODIUM CHLORIDE 0.9% FLUSH
3.0000 mL | Freq: Two times a day (BID) | INTRAVENOUS | Status: DC
  Administered 2018-12-22 – 2018-12-30 (×12): 3 mL via INTRAVENOUS

## 2018-12-22 MED ORDER — METHYLPREDNISOLONE SODIUM SUCC 125 MG IJ SOLR
80.0000 mg | Freq: Two times a day (BID) | INTRAMUSCULAR | Status: DC
  Administered 2018-12-22 – 2018-12-25 (×7): 80 mg via INTRAVENOUS
  Filled 2018-12-22 (×7): qty 2

## 2018-12-22 MED ORDER — ENOXAPARIN SODIUM 100 MG/ML ~~LOC~~ SOLN
90.0000 mg | Freq: Two times a day (BID) | SUBCUTANEOUS | Status: DC
  Administered 2018-12-22: 90 mg via SUBCUTANEOUS
  Filled 2018-12-22 (×3): qty 0.9

## 2018-12-22 MED ORDER — ENOXAPARIN SODIUM 40 MG/0.4ML ~~LOC~~ SOLN
40.0000 mg | Freq: Two times a day (BID) | SUBCUTANEOUS | Status: DC
  Administered 2018-12-22 – 2018-12-25 (×8): 40 mg via SUBCUTANEOUS
  Filled 2018-12-22 (×9): qty 0.4

## 2018-12-22 MED ORDER — SODIUM CHLORIDE 0.9 % IV SOLN: 250.0000 mL | INTRAVENOUS | Status: DC | PRN

## 2018-12-22 MED ORDER — TOCILIZUMAB 400 MG/20ML IV SOLN
8.0000 mg/kg | Freq: Once | INTRAVENOUS | Status: DC
  Filled 2018-12-22: qty 31.5

## 2018-12-22 MED ORDER — SODIUM CHLORIDE (PF) 0.9 % IJ SOLN
INTRAMUSCULAR | Status: AC
  Filled 2018-12-22: qty 50

## 2018-12-22 MED ORDER — ACETAMINOPHEN 325 MG PO TABS
650.0000 mg | ORAL_TABLET | Freq: Four times a day (QID) | ORAL | Status: DC | PRN
  Filled 2018-12-22: qty 2

## 2018-12-22 MED ORDER — VITAMIN C 500 MG PO TABS
500.0000 mg | ORAL_TABLET | Freq: Every day | ORAL | Status: DC
  Administered 2018-12-22 – 2018-12-31 (×10): 500 mg via ORAL
  Filled 2018-12-22 (×10): qty 1

## 2018-12-22 MED ORDER — SODIUM CHLORIDE 0.9% FLUSH: 3.0000 mL | INTRAVENOUS | Status: DC | PRN

## 2018-12-22 MED ORDER — SODIUM CHLORIDE 0.9 % IV SOLN
500.0000 mg | Freq: Every day | INTRAVENOUS | Status: DC
  Administered 2018-12-22: 03:00:00 500 mg via INTRAVENOUS
  Filled 2018-12-22: qty 500

## 2018-12-22 MED ORDER — ZINC SULFATE 220 (50 ZN) MG PO CAPS
220.0000 mg | ORAL_CAPSULE | Freq: Every day | ORAL | Status: DC
  Administered 2018-12-22 – 2018-12-31 (×10): 220 mg via ORAL
  Filled 2018-12-22 (×10): qty 1

## 2018-12-22 MED ORDER — ENOXAPARIN SODIUM 40 MG/0.4ML ~~LOC~~ SOLN: 40.0000 mg | SUBCUTANEOUS | Status: DC

## 2018-12-22 NOTE — Progress Notes (Signed)
ANTICOAGULATION CONSULT NOTE - Initial Consult  Pharmacy Consult for enoxaparin Indication: Treatment dose for COVID + patient, per MD-Increased clot risk--   No Known Allergies  Patient Measurements: Weight: 192 lb (87.1 kg) Heparin Dosing Weight:   Vital Signs: Temp: 99.9 F (37.7 C) (05/06 2044) Temp Source: Oral (05/06 2044) BP: 132/88 (05/07 0000) Pulse Rate: 79 (05/07 0000)  Labs: Recent Labs    12/21/18 2236  HGB 16.1  HCT 47.8  PLT 396  CREATININE 0.54*    Estimated Creatinine Clearance: 115.9 mL/min (A) (by C-G formula based on SCr of 0.54 mg/dL (L)).   Medical History: Past Medical History:  Diagnosis Date  . Hypertension     Medications:  Scheduled:  . enoxaparin (LOVENOX) injection  90 mg Subcutaneous BID    Assessment: Patient with COVID + result and MD wants treatment dose enoxaparin for increased clot risk.  No oral anticoagulants noted on med rec.   Goal of Therapy:  Anti-Xa level 0.6-1 units/ml 4hrs after LMWH dose given Monitor platelets by anticoagulation protocol: Yes   Plan:   Enoxaparin 90mg  sq q12hr CBC q 3 days    Aleene Davidson Crowford 12/22/2018,12:43 AM

## 2018-12-22 NOTE — H&P (Signed)
Cameron Hampton ZOX:[id number] DOB: 1965/09/27 DOA: 12/21/2018     PCP: Patient, No Pcp Per   Outpatient Specialists:   NONE    Patient arrived to ER on 12/21/18 at 2019  Patient coming from: home Lives  With family    Chief Complaint:  Chief Complaint  Patient presents with  . Nausea  . Emesis  . Diarrhea    HPI: Cameron Hampton is a 53 y.o. male with medical history significant of known COVID 19 infection,  HTN    Presented with   hypoxemia emergency department on 2 May with fevers and ongoing cough headache myalgias and diarrhea decreased p.o. intake. His wife was recently diagnosed with COVID-19 He was found to be positive for COVID-19 on 2 May.  Was found to have left lower lung infiltrate and started on Levaquin.  He was not hypoxic at that time and was able to be discharged home.  Since then he has had worsening nausea vomiting and worsens of breath Patient was brought from home today requiring non-rebreather satting 80% on arrival No chest pain     Regarding pertinent Chronic problems:   History of hypertension on propranolol   PF Readings from Last 1 Encounters:  No data found for PF     While in ER:  The following Work up has been ordered so far:  Orders Placed This Encounter  Procedures  . Blood Culture (routine x 2)  . DG Chest Port 1 View  . Lactic acid, plasma  . CBC WITH DIFFERENTIAL  . Comprehensive metabolic panel  . D-dimer, quantitative  . Procalcitonin  . Lactate dehydrogenase  . Ferritin  . Triglycerides  . Fibrinogen  . C-reactive protein  . Lipase, blood  . Diet NPO time specified  . Cardiac monitoring  . Insert peripheral IV x 2  . Initiate Carrier Fluid Protocol  . Place surgical mask on patient  . Patient to wear surgical mask during transportation  . Assess patient for ability to self-prone. If able (can move self in bed, ambulate) and stable (SpO2 and oxygen requirement):  Marland Kitchen Notify EDP if new oxygen requirements  escalates > 4L per minute [place]  . RN to draw the following extra tubes:  . Place on droplet/contact if:  Marland Kitchen Place on airborne/contact if:  . RN/NT - Document specific oxygen requirements in CHL  . Consult to hospitalist  . Pulse oximetry, continuous  . ED EKG 12-Lead  . EKG 12-Lead    Following Medications were ordered in ER: Medications  sodium chloride 0.9 % bolus 1,000 mL (1,000 mLs Intravenous New Bag/Given 12/21/18 2214)  prochlorperazine (COMPAZINE) injection 10 mg (10 mg Intravenous Given 12/21/18 2217)  diphenhydrAMINE (BENADRYL) injection 25 mg (25 mg Intravenous Given 12/21/18 2217)  morphine 2 MG/ML injection 2 mg (2 mg Intravenous Given 12/21/18 2216)        Consult Orders  (From admission, onward)         Start     Ordered   12/21/18 2356  Consult to hospitalist  Once    Provider:  Therisa Doyne, MD  Question Answer Comment  Place call to: [facility]   Reason for Consult Admit      12/21/18 2355           Significant initial  Findings: Abnormal Labs Reviewed  CBC WITH DIFFERENTIAL/PLATELET - Abnormal; Notable for the following components:      Result Value   Neutro Abs 8.6 (*)    All other components  within normal limits  COMPREHENSIVE METABOLIC PANEL - Abnormal; Notable for the following components:   Sodium 133 (*)    Glucose, Bld 141 (*)    Creatinine, Ser 0.54 (*)    Calcium 8.3 (*)    Albumin 2.8 (*)    AST 53 (*)    ALT 50 (*)    Alkaline Phosphatase 166 (*)    All other components within normal limits  D-DIMER, QUANTITATIVE (NOT AT [facility]) - Abnormal; Notable for the following components:   D-Dimer, Quant 1.04 (*)    All other components within normal limits  LACTATE DEHYDROGENASE - Abnormal; Notable for the following components:   LDH 352 (*)    All other components within normal limits  FIBRINOGEN - Abnormal; Notable for the following components:   Fibrinogen >800 (*)    All other components within normal limits  C-REACTIVE PROTEIN  - Abnormal; Notable for the following components:   CRP 19.6 (*)    All other components within normal limits     Otherwise labs showing:    Recent Labs  Lab 12/17/18 0919 12/21/18 2236  NA 133* 133*  K 3.8 3.9  CO2 25 25  GLUCOSE 159* 141*  BUN 15 11  CREATININE 0.65 0.54*  CALCIUM 8.3* 8.3*    Cr   stable,   Lab Results  Component Value Date   CREATININE 0.54 (L) 12/21/2018   CREATININE 0.65 12/17/2018    Recent Labs  Lab 12/17/18 0919 12/21/18 2236  AST 39 53*  ALT 32 50*  ALKPHOS 112 166*  BILITOT 0.9 0.6  PROT 7.0 7.0  ALBUMIN 3.4* 2.8*   Lab Results  Component Value Date   CALCIUM 8.3 (L) 12/21/2018    WBC      Component Value Date/Time   WBC 10.2 12/21/2018 2236      Component Value Date/Time   NEUTROABS 8.6 (H) 12/21/2018 2236   ABS lymphocytes 0.7 Plt: Lab Results  Component Value Date   PLT 396 12/21/2018    Lactic Acid, Venous    Component Value Date/Time   LATICACIDVEN 1.0 12/21/2018 2236    Procalcitonin WNL      Component Value Date/Time   LDH 352 (H) 12/21/2018 2236      Lab Results  Component Value Date   CRP 19.6 (H) 12/21/2018   Lab Results  Component Value Date   DDIMER 1.04 (H) 12/21/2018       HG/HCT   Stable     Component Value Date/Time   HGB 16.1 12/21/2018 2236   HCT 47.8 12/21/2018 2236    Recent Labs  Lab 12/21/18 2236  LIPASE 37    Troponin pending Cardiac Panel (last 3 results) No results for input(s): CKTOTAL, CKMB, TROPONINI, RELINDX in the last 72 hours.       UA  not ordered   CXR - Worsening bibasilar ground-glass opacity/pneumonias. CTA - ordered   ECG:  Personally reviewed by me showing: HR :89 Rhythm:  NSR,   no evidence of ischemic changes QTC 440      ED Triage Vitals  Enc Vitals Group     BP 12/21/18 2044 (!) 145/92     Pulse Rate 12/21/18 2044 89     Resp 12/21/18 2044 (!) 21     Temp 12/21/18 2044 99.9 F (37.7 C)     Temp Source 12/21/18 2044 Oral     SpO2  12/21/18 2044 100 %     Weight 12/21/18 2044 192 lb (87.1  kg)     Height --      Head Circumference --      Peak Flow --      Pain Score 12/21/18 2151 9     Pain Loc --      Pain Edu? --      Excl. in GC? --   TMAX(24)@       Latest  Blood pressure 127/89, pulse 84, temperature 99.9 F (37.7 C), temperature source Oral, resp. rate (!) 29, weight 87.1 kg, SpO2 98 %.    Hospitalist was called for admission for acute respiratory failure secondary to COVID-19   Review of Systems:    Pertinent positives include:  Fevers, chills,  shortness of breath at rest.   dyspnea on exertion, non-productive cough, Constitutional:  No weight loss, night sweats, fatigue, weight loss  HEENT:  No headaches, Difficulty swallowing,Tooth/dental problems,Sore throat,  No sneezing, itching, ear ache, nasal congestion, post nasal drip,  Cardio-vascular:  No chest pain, Orthopnea, PND, anasarca, dizziness, palpitations.no Bilateral lower extremity swelling  GI:  No heartburn, indigestion, abdominal pain, nausea, vomiting, diarrhea, change in bowel habits, loss of appetite, melena, blood in stool, hematemesis Resp:    No excess mucus, no productive cough,  No coughing up of blood.No change in color of mucus.No wheezing. Skin:  no rash or lesions. No jaundice GU:  no dysuria, change in color of urine, no urgency or frequency. No straining to urinate.  No flank pain.  Musculoskeletal:  No joint pain or no joint swelling. No decreased range of motion. No back pain.  Psych:  No change in mood or affect. No depression or anxiety. No memory loss.  Neuro: no localizing neurological complaints, no tingling, no weakness, no double vision, no gait abnormality, no slurred speech, no confusion  All systems reviewed and apart from HOPI all are negative  Past Medical History:   Past Medical History:  Diagnosis Date  . Hypertension       History reviewed. No pertinent surgical history.  Social History:   Ambulatory   independently      reports that he has never smoked. He has never used smokeless tobacco. He reports previous alcohol use. No history on file for drug.    Family History:   Family History  Problem Relation Age of Onset  . Hypertension Other     Allergies: No Known Allergies   Prior to Admission medications   Medication Sig Start Date End Date Taking? Authorizing Provider  ibuprofen (ADVIL) 200 MG tablet Take 400 mg by mouth every 6 (six) hours as needed for moderate pain.    [provider]  levofloxacin (LEVAQUIN) 500 MG tablet Take 1 tablet (500 mg total) by mouth daily. 12/17/18   Azalia Bilis, MD  propranolol (INDERAL) 40 MG tablet Take 40 mg by mouth daily.    [provider]   Physical Exam: Blood pressure 127/89, pulse 84, temperature 99.9 F (37.7 C), temperature source Oral, resp. rate (!) 29, weight 87.1 kg, SpO2 98 %. 1. General:  in  Acute distress increased work of breathing    acutely ill -appearing 2. Psychological: Alert and  Oriented 3. Head/ENT:    Dry Mucous Membranes                          Head Non traumatic, neck supple  Poor Dentition 4. SKIN:  decreased Skin turgor,  Skin clean Dry and intact no rash 5. Heart: Regular rate and rhythm no  Murmur, no Rub or gallop 6. Lungs:  no wheezes or crackles   7. Abdomen: Soft,  non-tender, Non distended   obese  8. Lower extremities: no clubbing, cyanosis, no edema 9. Neurologically Grossly intact, moving all 4 extremities equally   10. MSK: Normal range of motion   All other LABS:     Recent Labs  Lab 12/17/18 0919 12/21/18 2236  WBC 7.1 10.2  NEUTROABS 5.3 8.6*  HGB 16.5 16.1  HCT 49.2 47.8  MCV 90.8 89.3  PLT 219 396     Recent Labs  Lab 12/17/18 0919 12/21/18 2236  NA 133* 133*  K 3.8 3.9  CL 98 98  CO2 25 25  GLUCOSE 159* 141*  BUN 15 11  CREATININE 0.65 0.54*  CALCIUM 8.3* 8.3*     Recent Labs  Lab 12/17/18 0919 12/21/18  2236  AST 39 53*  ALT 32 50*  ALKPHOS 112 166*  BILITOT 0.9 0.6  PROT 7.0 7.0  ALBUMIN 3.4* 2.8*       Cultures:    Component Value Date/Time   SDES  12/21/2018 2236    BLOOD RIGHT FOREARM Performed at [facility], [address] [place], [place] [address]    SDES  12/21/2018 2236    BLOOD LEFT FOREARM Performed at [facility], [address] [place], [place] [address]    SPECREQUEST  12/21/2018 2236    BOTTLES DRAWN AEROBIC AND ANAEROBIC Blood Culture adequate volume Performed at [facility], [address], [place], [place] [address]    SPECREQUEST  12/21/2018 2236    BOTTLES DRAWN AEROBIC AND ANAEROBIC Blood Culture adequate volume Performed at [facility], [address] [place], [place] [address]    CULT PENDING 12/21/2018 2236   CULT PENDING 12/21/2018 2236   REPTSTATUS PENDING 12/21/2018 2236   REPTSTATUS PENDING 12/21/2018 2236     Radiological Exams on Admission: Dg Chest Port 1 View  Result Date: 12/21/2018 CLINICAL DATA:  Shortness of breath and cough EXAM: PORTABLE CHEST 1 VIEW COMPARISON:  12/17/2018 FINDINGS: Bibasilar ground-glass opacities. These are increased as compared with 12/17/2018. Stable cardiomediastinal silhouette. No pneumothorax. IMPRESSION: Worsening bibasilar ground-glass opacity/pneumonias. Electronically Signed   By: Jasmine Pang M.D.   On: 12/21/2018 21:11    Chart has been reviewed    Assessment/Plan  53 y.o. male with medical history significant of known COVID 19 infection,  HTN   Admitted for  acute respiratory failure secondary to COVID-19   Present on Admission: . Acute respiratory disease due to COVID-19 virus -   FROM HOME WITH KNOWN HX OF COVID19 Initial Rapid Novel Corona Virus testing: Ordered 12/19/2018 and is  positive  - new severe hypoxia  Following concerning LAB/ imaging findings:      ANC/ALC ratio>3.5   LFTs: increased AST/ALT/Tbili   CRP, LDH:  increased   Procalcitonin: low   CXR: hazy bilateral peripheral opacities or otherwise abnormal        -Following work-up initiated:        sputum cultures  Ordered 12/22/18, Blood cultures  Ordered 12/22/18,      Following complications noted:    will be monitoring for evidence of other complications such as PE/DVT CVA or  cardiovascular events - Check CTA given severity of hypoxia to further evaluate  Plan of treatment: -  [facility] if [facility] and beds are available    - Will follow daily d.dimer - Assess for ability to prone  - Supportive management -Fluid sparing resuscitation  -Provide oxygen as needed currently on Non-rebreather SpO2: 99 % - IF d.dimer elvated will increase dose of lovenox -Initiate antibiotics  given evidence worrisome for superinfection  - Consult PCCM if becomes respiratory unstable   Poor Prognostic factors  53 y.o.   failure requiring >4L [place]  tachypnea,   ABS neutrophil to lymphocyte ratio >3.5  Risk for transmission of COVID 19   HIGH  Given Presence   evidence of severe cough, oxygen requirement above 4 L,  Airborne precautions ordered     Will order Airborne and Contact precautions     . Essential hypertension - chronic stable, BP cont home meds    Other plan as per orders.  DVT prophylaxis:   Lovenox   Full dose  Code Status:  FULL CODE   as per patient   I had personally discussed CODE STATUS with patient    Family Communication:   Family not at  Bedside    Disposition Plan:    To home once workup is complete and patient is stable                                       Consults called: none    Admission status:  ED Disposition    ED Disposition Condition Comment   Admit  [facility] [[id number]]  Level of Care: Progressive [102]  Covid Evaluation: Confirmed COVID Positive  Isolation Risk Level: High Risk/Airborne (Aerosolizing procedure, nebulizer,  intubated/ventilation, CPAP/BiPAP)  Diagnosis: Acute respiratory disease due to COVID-19 virus [[id number]][[phone]]  Admitting Physician: Therisa DoyneUTOVA, Delrick Dehart [[id number]]  Attending Physician: Therisa DoyneUTOVA, Zaydrian Batta [[id number]]  Estimated length of stay: 3 - 4 days  Certification:: I certify this patient will need inpatient services for at least 2 midnights  PT Class (Do Not Modify): Inpatient [101]  PT Acc Code (Do Not Modify): Private [1]        inpatient     Expect 2 midnight stay secondary to severity of patient's current illness including   hemodynamic instability despite optimal treatment (tachycardia  hypoxia,   )   Severe lab/radiological/exam abnormalities including: viral PNA       That are currently affecting medical management.   I expect  patient to be hospitalized for 2 midnights requiring inpatient medical care.  Patient is at high risk for adverse outcome (such as loss of life or disability) if not treated.  Indication for inpatient stay as follows:    Hemodynamic instability despite maximal medical therapy,    severe pain requiring acute inpatient management,  inability to maintain oral hydration    New or worsening hypoxia  Need for IV antibiotics, IV fluids,     Level of care     SDU tele indefinitely please discontinue once patient no longer qualifies  Precautions:  airborne Airborne and Contact precautions  PPE: Used by the provider:   P100  eye Goggles,  Gloves  gown      Verbie Babic 12/22/2018, 1:08 AM    [facility]     after 2 AM please page floor coverage PA If 7AM-7PM, please contact the day team taking care of the patient using [email or web address]

## 2018-12-22 NOTE — Progress Notes (Signed)
RN and NT unable to get EKG. Patient too sweaty and stickers unable to stick. Will try again later.

## 2018-12-22 NOTE — Progress Notes (Signed)
Lovenox per Pharmacy for DVT Prophylaxis    Pharmacy has been consulted from dosing enoxaparin (lovenox) in this patient for DVT prophylaxis.  The pharmacist has reviewed pertinent labs (Hgb _16.1__; PLT_396__), patient weight (_78.7__kg) and renal function (CrCl_>90__mL/min) and decided that enoxaparin _40_mg SQ Q24Hrs is appropriate for this patient. The patient's D-Dimer = 1.04 and therefore using latest CCM DVT prophylaxis dosing regimen, treatment dosing is not recommended as was initially ordered. The pharmacy department will sign off at this time.  Please reconsult pharmacy if status changes or for further issues.  Thank you  Luetta Nutting PharmD, BCPS  12/22/2018, 4:25 AM

## 2018-12-22 NOTE — Progress Notes (Signed)
Updated wife by phone

## 2018-12-22 NOTE — Care Management (Signed)
Hospital f/u televisit appointment has been scheduled with: [facility] on 01/02/19 @ 1430; AVS updated.   Colleen Can RN, BSN, NCM-BC, ACM-RN [phone]

## 2018-12-22 NOTE — Progress Notes (Addendum)
PROGRESS NOTE                                                                                                                                                                                                             Patient Demographics:    Cameron Hampton, is a 53 y.o. male, DOB - [birth year], VHQ:[id number]  Outpatient Primary MD for the patient is Patient, No Pcp Per    LOS - 0  Chief Complaint  Patient presents with   Nausea   Emesis   Diarrhea       Brief Narrative this is a 53 year old Hispanic gentleman with past medical history of hypertension who was exposed to his wife who has a positive COVID-19 infection about 2 weeks ago comes in with fever, sore throat, diarrhea, extreme shortness of breath.  Diagnosed with COVID-19 pneumonia and hypoxic respiratory failure and admitted to [facility].   Subjective:    Cameron Hampton today has, No headache, No chest pain, No abdominal pain - No Nausea, No new weakness tingling or numbness, does have sore throat, cough and moderate shortness of breath.   Assessment  & Plan :      1. Acute Hypoxic Resp. Failure due to Acute Covid 19 Viral Illness during the ongoing 2020 Covid 19 Pandemic - CT scan and chest x-ray confirmed groundglass opacities consistent with COVID-19 pneumonia, has profound hypoxia requiring high flow nasal cannula oxygen, inflammatory markers are significantly elevated.  At this time Actemra was discussed with the patient and given to him understands the risks and benefits, he will also be placed on IV steroids.  Encouraged to sit up in chair and prone himself if he is in bed, flutter valve added for pulmonary toiletry.  Will place him on intermediate dose Lovenox twice daily dose due to increased inflammation and monitor closely.  Stop antibiotics I do not think he has bacterial infection.   COVID-19 Labs  Recent Labs    12/21/18 2236  12/22/18 0630  DDIMER 1.04*  --   FERRITIN 2,117* 1,890*  LDH 352*  --   CRP 19.6* 18.3*    Lab Results  Component Value Date   SARSCOV2NAA POSITIVE (A) 12/17/2018     2.  Diarrhea.  Due to COVID-19 infection.  Improved.  Continue supportive care as needed.  3.  Pharyngitis.  Likely due to  COVID-19 infection but will check strep throat as well.  4. Incidental 4.1 cm ascending thoracic aortic aneurysm - outpt CVTS follow up in 3 mths, B blocker once clinically stable.  5.  Hypertension.  Resume home dose beta-blocker once clinically stable from COVID-19 infection.    Condition - Extremely Guarded  Family Communication  :  None  Code Status :  Full  Diet : Heart Healthy  Disposition Plan  :  TBD  Consults  :  None  Procedures  :    CTA - Extensive patchy bilateral ground-glass airspace opacities, likely related to patient's known COVID-19. No evidence of pulmonary embolus. 4.1 cm ascending thoracic aortic aneurysm. Recommend annual imaging followup by CTA or MRA. This recommendation follows 2010   DVT Prophylaxis  :  Lovenox    Lab Results  Component Value Date   PLT 400 12/22/2018    Inpatient Medications  Scheduled Meds:  [START ON 12/23/2018] enoxaparin (LOVENOX) injection  40 mg Subcutaneous Q24H   methylPREDNISolone (SOLU-MEDROL) injection  80 mg Intravenous Q12H   propranolol  40 mg Oral Daily   sodium chloride (PF)       sodium chloride flush  3 mL Intravenous Q12H   sodium chloride flush  3 mL Intravenous Q12H   vitamin C  500 mg Oral Daily   zinc sulfate  220 mg Oral Daily   Continuous Infusions:  sodium chloride     azithromycin 500 mg (12/22/18 0324)   cefTRIAXone (ROCEPHIN)  IV 1 g (12/22/18 0325)   tocilizumab (ACTEMRA) IV     PRN Meds:.sodium chloride, acetaminophen, guaiFENesin-dextromethorphan, HYDROcodone-acetaminophen, ondansetron **OR** ondansetron (ZOFRAN) IV, sodium chloride flush  Antibiotics  :    Anti-infectives (From  admission, onward)   Start     Dose/Rate Route Frequency Ordered Stop   12/22/18 0330  cefTRIAXone (ROCEPHIN) 1 g in sodium chloride 0.9 % 100 mL IVPB     1 g 200 mL/hr over 30 Minutes Intravenous Daily 12/22/18 0250     12/22/18 0330  azithromycin (ZITHROMAX) 500 mg in sodium chloride 0.9 % 250 mL IVPB     500 mg 250 mL/hr over 60 Minutes Intravenous Daily 12/22/18 0250         Time Spent in minutes  30   Susa Raring M.D on 12/22/2018 at 9:33 AM  To page go to [email or web address] - password [email or web address]  [facility] -  Office  [phone]  See all Orders from today for further details  Admit date - 12/21/2018    0    Objective:   Vitals:   12/22/18 0500 12/22/18 0544 12/22/18 0800 12/22/18 0911  BP: 134/86  (!) 128/97   Pulse: 88  84   Resp: (!) 27  (!) 23   Temp:  98.8 F (37.1 C)  99.1 F (37.3 C)  TempSrc:  Oral  Oral  SpO2: 92%  (!) 88%   Weight:      Height:        Wt Readings from Last 3 Encounters:  12/22/18 78.7 kg  12/17/18 87.1 kg     Intake/Output Summary (Last 24 hours) at 12/22/2018 0933 Last data filed at 12/22/2018 0911 Gross per 24 hour  Intake 556.22 ml  Output 950 ml  Net -393.78 ml     Physical Exam  Awake Alert, Oriented X 3, No new F.N deficits, Normal affect [place].AT,PERRAL Supple Neck,No JVD, No cervical lymphadenopathy appriciated.  Symmetrical Chest wall movement, Good air movement bilaterally, few rales RRR,No Gallops,Rubs or new  Murmurs, No Parasternal Heave +ve B.Sounds, Abd Soft, No tenderness, No organomegaly appriciated, No rebound - guarding or rigidity. No Cyanosis, Clubbing or edema, No new Rash or bruise       Data Review:    CBC Recent Labs  Lab 12/17/18 0919 12/21/18 2236 12/22/18 0630  WBC 7.1 10.2 9.5  HGB 16.5 16.1 14.9  HCT 49.2 47.8 44.9  PLT 219 396 400  MCV 90.8 89.3 90.3  MCH 30.4 30.1 30.0  MCHC 33.5 33.7 33.2  RDW 13.1 13.1 13.2  LYMPHSABS 0.8 0.7 0.9  MONOABS 0.8 0.8 0.8  EOSABS 0.0 0.0 0.0    BASOSABS 0.0 0.0 0.0    Chemistries  Recent Labs  Lab 12/17/18 0919 12/21/18 2236 12/22/18 0630  NA 133* 133* 137  K 3.8 3.9 3.8  CL 98 98 102  CO2 25 25 26   GLUCOSE 159* 141* 116*  BUN 15 11 13   CREATININE 0.65 0.54* 0.64  CALCIUM 8.3* 8.3* 8.1*  MG  --   --  2.1  AST 39 53* 44*  ALT 32 50* 43  ALKPHOS 112 166* 151*  BILITOT 0.9 0.6 0.8   ------------------------------------------------------------------------------------------------------------------ Recent Labs    12/21/18 2236  TRIG 132    No results found for: HGBA1C ------------------------------------------------------------------------------------------------------------------ No results for input(s): TSH, T4TOTAL, T3FREE, THYROIDAB in the last 72 hours.  Invalid input(s): FREET3  Cardiac Enzymes No results for input(s): CKMB, TROPONINI, MYOGLOBIN in the last 168 hours.  Invalid input(s): CK ------------------------------------------------------------------------------------------------------------------ No results found for: BNP  Micro Results Recent Results (from the past 240 hour(s))  SARS Coronavirus 2 [facility] order, Performed in [facility] hospital lab)     Status: Abnormal   Collection Time: 12/17/18  9:19 AM  Result Value Ref Range Status   SARS Coronavirus 2 POSITIVE (A) NEGATIVE Final    Comment: RESULT CALLED TO, READ BACK BY AND VERIFIED WITH: P.DOWD,RN [id number] @1235  BY V.WILKINS (NOTE) If result is NEGATIVE SARS-CoV-2 target nucleic acids are NOT DETECTED. The SARS-CoV-2 RNA is generally detectable in upper and lower  respiratory specimens during the acute phase of infection. The lowest  concentration of SARS-CoV-2 viral copies this assay can detect is 250  copies / mL. A negative result does not preclude SARS-CoV-2 infection  and should not be used as the sole basis for treatment or other  patient management decisions.  A negative result may occur with  improper specimen collection  / handling, submission of specimen other  than nasopharyngeal swab, presence of viral mutation(s) within the  areas targeted by this assay, and inadequate number of viral copies  (<250 copies / mL). A negative result must be combined with clinical  observations, patient history, and epidemiological information. If result is POSITIVE SARS-CoV-2 target nucleic acids are DETECTED. T he SARS-CoV-2 RNA is generally detectable in upper and lower  respiratory specimens during the acute phase of infection.  Positive  results are indicative of active infection with SARS-CoV-2.  Clinical  correlation with patient history and other diagnostic information is  necessary to determine patient infection status.  Positive results do  not rule out bacterial infection or co-infection with other viruses. If result is PRESUMPTIVE POSTIVE SARS-CoV-2 nucleic acids MAY BE PRESENT.   A presumptive positive result was obtained on the submitted specimen  and confirmed on repeat testing.  While 2019 novel coronavirus  (SARS-CoV-2) nucleic acids may be present in the submitted sample  additional confirmatory testing may be necessary for epidemiological  and / or clinical management  purposes  to differentiate between  SARS-CoV-2 and other Sarbecovirus currently known to infect humans.  If clinically indicated additional testing with an alternate test  methodology [phone]) is  advised. The SARS-CoV-2 RNA is generally  detectable in upper and lower respiratory specimens during the acute  phase of infection. The expected result is Negative. Fact Sheet for Patients:  BoilerBrush.com.cy Fact Sheet for Healthcare Providers: https://pope.com/ This test is not yet approved or cleared by the [place] FDA and has been authorized for detection and/or diagnosis of SARS-CoV-2 by FDA under an Emergency Use Authorization (EUA).  This EUA will remain in effect (meaning this  test can be used) for the duration of the COVID-19 declaration under Section 564(b)(1) of the Act, 21 U.S.C. section 360bbb-3(b)(1), unless the authorization is terminated or revoked sooner. Performed at [facility], [address] [place], [place] [address]   Blood Culture (routine x 2)     Status: None   Collection Time: 12/17/18  9:19 AM  Result Value Ref Range Status   Specimen Description   Final    BLOOD LEFT ANTECUBITAL Performed at [facility], [address], [place], [place] [address]    Special Requests   Final    BOTTLES DRAWN AEROBIC AND ANAEROBIC Blood Culture adequate volume Performed at [facility], [address], [place], [place] [address]    Culture   Final    NO GROWTH 5 DAYS Performed at [facility], [address], [place], [place] [address]    Report Status 12/22/2018 FINAL  Final  Blood Culture (routine x 2)     Status: None   Collection Time: 12/17/18 10:49 AM  Result Value Ref Range Status   Specimen Description   Final    BLOOD RIGHT ANTECUBITAL Performed at [facility], [address], [place], [place] [address]    Special Requests   Final    BOTTLES DRAWN AEROBIC AND ANAEROBIC Blood Culture results may not be optimal due to an excessive volume of blood received in culture bottles Performed at [facility], [address] [place], [place] [address]    Culture   Final    NO GROWTH 5 DAYS Performed at [facility], [address], [place], [place] [address]    Report Status 12/22/2018 FINAL  Final  Blood Culture (routine x 2)     Status: None (Preliminary result)   Collection Time: 12/21/18 10:36 PM  Result Value Ref Range Status   Specimen Description   Final    BLOOD RIGHT FOREARM Performed at [facility], [address], [place], [place] [address]    Special Requests   Final    BOTTLES DRAWN AEROBIC AND ANAEROBIC Blood Culture adequate  volume Performed at [facility], [address] [place], [place] [address]    Culture   Final    NO GROWTH < 12 HOURS Performed at [facility], [address], [place], [place] [address]    Report Status PENDING  Incomplete  Blood Culture (routine x 2)     Status: None (Preliminary result)   Collection Time: 12/21/18 10:36 PM  Result Value Ref Range Status   Specimen Description   Final    BLOOD LEFT FOREARM Performed at [facility], [address] [place], [place] [address]    Special Requests   Final    BOTTLES DRAWN AEROBIC AND ANAEROBIC Blood  Culture adequate volume Performed at [facility], [address] [place], [place] [address]    Culture   Final    NO GROWTH < 12 HOURS Performed at [facility], [address] [place], [place] [address]    Report Status PENDING  Incomplete    Radiology Reports Ct Angio Chest Pe W Or Wo Contrast  Result Date: 12/22/2018 CLINICAL DATA:  Positive D-dimer.  Hypoxemia.  Positive for COVID-19 EXAM: CT ANGIOGRAPHY CHEST WITH CONTRAST TECHNIQUE: Multidetector CT imaging of the chest was performed using the standard protocol during bolus administration of intravenous contrast. Multiplanar CT image reconstructions and MIPs were obtained to evaluate the vascular anatomy. CONTRAST:  100mL OMNIPAQUE IOHEXOL 350 MG/ML SOLN COMPARISON:  Chest x-ray 12/21/2018 FINDINGS: Cardiovascular: Heart is mildly enlarged. Mild aneurysmal dilatation of the ascending thoracic aorta, 4.1 cm. No filling defects in the pulmonary arteries to suggest pulmonary emboli. Mediastinum/Nodes: No mediastinal, hilar, or axillary adenopathy. Lungs/Pleura: Patchy ground-glass opacities throughout both lungs, predominantly dependent but also noted in the apices. No effusions. This likely related to COVID-19. Upper Abdomen: Imaging into the upper abdomen shows no acute findings. Musculoskeletal: Chest wall soft tissues are  unremarkable. No acute bony abnormality. Review of the MIP images confirms the above findings. IMPRESSION: Extensive patchy bilateral ground-glass airspace opacities, likely related to patient's known COVID-19. No evidence of pulmonary embolus. 4.1 cm ascending thoracic aortic aneurysm. Recommend annual imaging followup by CTA or MRA. This recommendation follows 2010 ACCF/AHA/AATS/ACR/ASA/SCA/SCAI/SIR/STS/SVM Guidelines for the Diagnosis and Management of Patients with Thoracic Aortic Disease. Circulation. 2010; 121: U981-X914: [id number]. Aortic aneurysm NOS (ICD10-I71.9) Electronically Signed   By: Charlett NoseKevin  Dover M.D.   On: 12/22/2018 02:42   Dg Chest Port 1 View  Result Date: 12/21/2018 CLINICAL DATA:  Shortness of breath and cough EXAM: PORTABLE CHEST 1 VIEW COMPARISON:  12/17/2018 FINDINGS: Bibasilar ground-glass opacities. These are increased as compared with 12/17/2018. Stable cardiomediastinal silhouette. No pneumothorax. IMPRESSION: Worsening bibasilar ground-glass opacity/pneumonias. Electronically Signed   By: Jasmine PangKim  Fujinaga M.D.   On: 12/21/2018 21:11   Dg Chest Port 1 View  Result Date: 12/17/2018 CLINICAL DATA:  Dyspnea, spouse is COVID-19 positive EXAM: PORTABLE CHEST 1 VIEW COMPARISON:  None. FINDINGS: Normal heart size. Normal mediastinal contour. No pneumothorax. No pleural effusion. Mild patchy left lung base opacity. IMPRESSION: Mild patchy left lung base opacity compatible with pneumonia. Chest radiograph follow-up advised. Electronically Signed   By: Delbert PhenixJason A Poff M.D.   On: 12/17/2018 09:54

## 2018-12-23 ENCOUNTER — Inpatient Hospital Stay (HOSPITAL_COMMUNITY): Payer: Medicaid Other

## 2018-12-23 DIAGNOSIS — U071 COVID-19: Secondary | ICD-10-CM | POA: Diagnosis not present

## 2018-12-23 DIAGNOSIS — I10 Essential (primary) hypertension: Secondary | ICD-10-CM | POA: Diagnosis not present

## 2018-12-23 DIAGNOSIS — J069 Acute upper respiratory infection, unspecified: Secondary | ICD-10-CM | POA: Diagnosis not present

## 2018-12-23 LAB — CBC WITH DIFFERENTIAL/PLATELET
Abs Immature Granulocytes: 0.02 10*3/uL (ref 0.00–0.07)
Basophils Absolute: 0 10*3/uL (ref 0.0–0.1)
Basophils Relative: 0 %
Eosinophils Absolute: 0 10*3/uL (ref 0.0–0.5)
Eosinophils Relative: 0 %
HCT: 44.7 % (ref 39.0–52.0)
Hemoglobin: 14.6 g/dL (ref 13.0–17.0)
Immature Granulocytes: 0 %
Lymphocytes Relative: 9 %
Lymphs Abs: 0.5 10*3/uL — ABNORMAL LOW (ref 0.7–4.0)
MCH: 29.6 pg (ref 26.0–34.0)
MCHC: 32.7 g/dL (ref 30.0–36.0)
MCV: 90.5 fL (ref 80.0–100.0)
Monocytes Absolute: 0.3 10*3/uL (ref 0.1–1.0)
Monocytes Relative: 6 %
Neutro Abs: 4.5 10*3/uL (ref 1.7–7.7)
Neutrophils Relative %: 85 %
Platelets: 494 10*3/uL — ABNORMAL HIGH (ref 150–400)
RBC: 4.94 MIL/uL (ref 4.22–5.81)
RDW: 13.1 % (ref 11.5–15.5)
WBC: 5.3 10*3/uL (ref 4.0–10.5)
nRBC: 0 % (ref 0.0–0.2)

## 2018-12-23 LAB — COMPREHENSIVE METABOLIC PANEL
ALT: 83 U/L — ABNORMAL HIGH (ref 0–44)
AST: 75 U/L — ABNORMAL HIGH (ref 15–41)
Albumin: 2.8 g/dL — ABNORMAL LOW (ref 3.5–5.0)
Alkaline Phosphatase: 189 U/L — ABNORMAL HIGH (ref 38–126)
Anion gap: 11 (ref 5–15)
BUN: 23 mg/dL — ABNORMAL HIGH (ref 6–20)
CO2: 24 mmol/L (ref 22–32)
Calcium: 8.8 mg/dL — ABNORMAL LOW (ref 8.9–10.3)
Chloride: 103 mmol/L (ref 98–111)
Creatinine, Ser: 0.67 mg/dL (ref 0.61–1.24)
GFR calc Af Amer: >60 mL/min (ref >60)
GFR calc non Af Amer: >60 mL/min (ref >60)
Glucose, Bld: 232 mg/dL — ABNORMAL HIGH (ref 70–99)
Potassium: 4 mmol/L (ref 3.5–5.1)
Sodium: 138 mmol/L (ref 135–145)
Total Bilirubin: 0.7 mg/dL (ref 0.3–1.2)
Total Protein: 6.9 g/dL (ref 6.5–8.1)

## 2018-12-23 LAB — FERRITIN: Ferritin: 2293 ng/mL — ABNORMAL HIGH (ref 24–336)

## 2018-12-23 LAB — D-DIMER, QUANTITATIVE: D-Dimer, Quant: 1.07 ug/mL-FEU — ABNORMAL HIGH (ref 0.00–0.50)

## 2018-12-23 LAB — ABO/RH: ABO/RH(D): A POS

## 2018-12-23 LAB — MAGNESIUM: Magnesium: 2.3 mg/dL (ref 1.7–2.4)

## 2018-12-23 LAB — C-REACTIVE PROTEIN: CRP: 17.6 mg/dL — ABNORMAL HIGH (ref <1.0)

## 2018-12-23 LAB — GROUP A STREP BY PCR: Group A Strep by PCR: NEGATIVE — AB

## 2018-12-23 MED ORDER — PHENOL 1.4 % MT LIQD
1.0000 | OROMUCOSAL | Status: DC | PRN
  Administered 2018-12-23: 10:00:00 1 via OROMUCOSAL
  Filled 2018-12-23: qty 177

## 2018-12-23 MED ORDER — OXYMETAZOLINE HCL 0.05 % NA SOLN
1.0000 | Freq: Two times a day (BID) | NASAL | Status: DC
  Administered 2018-12-24 – 2018-12-26 (×5): 1 via NASAL
  Filled 2018-12-23: qty 15

## 2018-12-23 NOTE — Progress Notes (Signed)
PROGRESS NOTE  Cameron Hampton  LFY:[id number] DOB: 01/27/1966 DOA: 12/21/2018 PCP: None Brief Narrative: Cameron Hampton is a 53 y.o. male with a history of HTN who was exposed to his wife who has a positive COVID-19 infection about 2 weeks ago comes in with fever, sore throat, diarrhea, extreme shortness of breath.  Diagnosed with COVID-19 pneumonia and hypoxic respiratory failure and admitted to [facility]. Tocilizumab given 5/7.  Assessment & Plan: Active Problems:   Acute respiratory disease due to COVID-19 virus   Essential hypertension  Acute hypoxic respiratory failure due to covid-19 pneumonia: Remains hypoxic at rest in 86-88% despite nasal cannula oxygen, appears comfortable. Given typical course of illness, should be convalescing soon. - Lovenox 40mg  [place] BID - s/p tocilizumab 5/7.  - Solumedrol 80mg  IV q12h to continue for now. Inflammatory markers remain elevated.  - A f/u visit has been scheduled at [facility] on 5/18 by CM.  - Has always been a mouthbreather, can use face mask for O2 delivery. Will give afrin x3 days. Prone as able, IS as much as possible to encourage deep breathing. Discussed w/pt and RN. - Monitor blood cultures. s/p CTX, azithromycin 5/6.  Pharyngitis: Due to covid/cough. Rapid strep (GAS) and Strep Ag (Pneumococcus) negative. - Antitussives, chloraseptic spray.   HTN:  - Continue propranolol  Thoracic aortic aneurysm: Incidentally noted at 4.1cm. - Continue beta blocker - Recommend annual imaging followup by CTA or MRA.  History of pancreatitis: Suspected to be due to gallstones, no Hx alcohol use.  - Monitor  Steroid-induced hyperglycemia:  - Check HbA1c - Start mod SSI, HS coverage.   DVT prophylaxis: Lovenox as above Code Status: Full Family Communication: None at bedside Disposition Plan: Uncertain, likely home once improving from respiratory standpoint.  Consultants:   None  Procedures:   Tocilizumab 12/22/2018  Antimicrobials:   Ceftriaxone, azithromycin 5/6   Subjective: Wife was sick 3 days before him and is doing ok at home. He is breathing better, but trouble taking deep breaths and dyspnea associated with nonradiating, moderate, pleuritic midchest pain on deep breathing. No fever. No abd pain, N/V/D, joint or muscle aches. He has not been eating well for about 2 weeks due to poor appetite and nausea which is resolving.  [place] [phone] used throughout encounter.   Objective: Vitals:   12/23/18 0900 12/23/18 0935 12/23/18 0941 12/23/18 0952  BP:  (!) 136/91    Pulse: 83  83   Resp: (!) 27     Temp:    98.3 F (36.8 C)  TempSrc:    Oral  SpO2: (!) 89%     Weight:      Height:        Intake/Output Summary (Last 24 hours) at 12/23/2018 1037 Last data filed at 12/23/2018 1000 Gross per 24 hour  Intake 700 ml  Output 1050 ml  Net -350 ml   Filed Weights   12/21/18 2044 12/22/18 0200  Weight: 87.1 kg 78.7 kg    Gen: 53 y.o. male in no distress in chair Pulm: Labored with longer sentences, but comfortable appearing and tachypneic without crackles or wheezes.  CV: Regular rate and rhythm. No murmur, rub, or gallop. No JVD, no pedal edema. GI: Abdomen soft, non-tender, non-distended, with normoactive bowel sounds. No organomegaly or masses felt. Ext: Warm, no deformities Skin: No rashes, lesions or ulcers Neuro: Alert and oriented. No focal neurological deficits. Psych: Judgement and insight appear normal. Mood & affect appropriate.   Data Reviewed: I have personally  reviewed following labs and imaging studies  CBC: Recent Labs  Lab 12/17/18 0919 12/21/18 2236 12/22/18 0630 12/23/18 0433  WBC 7.1 10.2 9.5 5.3  NEUTROABS 5.3 8.6* 7.7 4.5  HGB 16.5 16.1 14.9 14.6  HCT 49.2 47.8 44.9 44.7  MCV 90.8 89.3 90.3 90.5  PLT 219 396 400 494*   Basic Metabolic Panel: Recent Labs  Lab 12/17/18 0919 12/21/18 2236 12/22/18 0630 12/23/18 0433  NA 133* 133* 137 138  K 3.8 3.9  3.8 4.0  CL 98 98 102 103  CO2 GLUCOSE 159* 141* 116* 232*  BUN 23*  CREATININE 0.65 0.54* 0.64 0.67  CALCIUM 8.3* 8.3* 8.1* 8.8*  MG  --   --  2.1 2.3   GFR: Estimated Creatinine Clearance: 115.9 mL/min (by C-G formula based on SCr of 0.67 mg/dL). Liver Function Tests: Recent Labs  Lab 12/17/18 0919 12/21/18 2236 12/22/18 0630 12/23/18 0433  AST 39 53* 44* 75*  ALT 32 50* 43 83*  ALKPHOS 112 166* 151* 189*  BILITOT 0.9 0.6 0.8 0.7  PROT 7.0 7.0 6.6 6.9  ALBUMIN 3.4* 2.8* 2.8* 2.8*   Recent Labs  Lab 12/21/18 2236  LIPASE 37   No results for input(s): AMMONIA in the last 168 hours. Coagulation Profile: No results for input(s): INR, PROTIME in the last 168 hours. Cardiac Enzymes: No results for input(s): CKTOTAL, CKMB, CKMBINDEX, TROPONINI in the last 168 hours. BNP (last 3 results) No results for input(s): PROBNP in the last 8760 hours. HbA1C: No results for input(s): HGBA1C in the last 72 hours. CBG: No results for input(s): GLUCAP in the last 168 hours. Lipid Profile: Recent Labs    12/21/18 2236  TRIG 132   Thyroid Function Tests: No results for input(s): TSH, T4TOTAL, FREET4, T3FREE, THYROIDAB in the last 72 hours. Anemia Panel: Recent Labs    12/22/18 0630 12/23/18 0433  FERRITIN 1,890* 2,293*   Urine analysis: No results found for: COLORURINE, APPEARANCEUR, LABSPEC, PHURINE, GLUCOSEU, HGBUR, BILIRUBINUR, KETONESUR, PROTEINUR, UROBILINOGEN, NITRITE, LEUKOCYTESUR Recent Results (from the past 240 hour(s))  SARS Coronavirus 2 [facility] order, Performed in [facility] hospital lab)     Status: Abnormal   Collection Time: 12/17/18  9:19 AM  Result Value Ref Range Status   SARS Coronavirus 2 POSITIVE (A) NEGATIVE Final    Comment: RESULT CALLED TO, READ BACK BY AND VERIFIED WITH: P.DOWD,RN [id number]  BY V.WILKINS (NOTE) If result is NEGATIVE SARS-CoV-2 target nucleic acids are NOT DETECTED. The SARS-CoV-2 RNA is generally  detectable in upper and lower  respiratory specimens during the acute phase of infection. The lowest  concentration of SARS-CoV-2 viral copies this assay can detect is 250  copies / mL. A negative result does not preclude SARS-CoV-2 infection  and should not be used as the sole basis for treatment or other  patient management decisions.  A negative result may occur with  improper specimen collection / handling, submission of specimen other  than nasopharyngeal swab, presence of viral mutation(s) within the  areas targeted by this assay, and inadequate number of viral copies  (<250 copies / mL). A negative result must be combined with clinical  observations, patient history, and epidemiological information. If result is POSITIVE SARS-CoV-2 target nucleic acids are DETECTED. T he SARS-CoV-2 RNA is generally detectable in upper and lower  respiratory specimens during the acute phase of infection.  Positive  results are indicative of active infection with SARS-CoV-2.  Clinical  correlation with  patient history and other diagnostic information is  necessary to determine patient infection status.  Positive results do  not rule out bacterial infection or co-infection with other viruses. If result is PRESUMPTIVE POSTIVE SARS-CoV-2 nucleic acids MAY BE PRESENT.   A presumptive positive result was obtained on the submitted specimen  and confirmed on repeat testing.  While 2019 novel coronavirus  (SARS-CoV-2) nucleic acids may be present in the submitted sample  additional confirmatory testing may be necessary for epidemiological  and / or clinical management purposes  to differentiate between  SARS-CoV-2 and other Sarbecovirus currently known to infect humans.  If clinically indicated additional testing with an alternate test  methodology [phone]) is  advised. The SARS-CoV-2 RNA is generally  detectable in upper and lower respiratory specimens during the acute  phase of infection. The  expected result is Negative. Fact Sheet for Patients:  BoilerBrush.com.cy Fact Sheet for Healthcare Providers: https://pope.com/ This test is not yet approved or cleared by the [place] FDA and has been authorized for detection and/or diagnosis of SARS-CoV-2 by FDA under an Emergency Use Authorization (EUA).  This EUA will remain in effect (meaning this test can be used) for the duration of the COVID-19 declaration under Section 564(b)(1) of the Act, 21 U.S.C. section 360bbb-3(b)(1), unless the authorization is terminated or revoked sooner. Performed at [facility], [address] [place], [place] [address]   Blood Culture (routine x 2)     Status: None   Collection Time: 12/17/18  9:19 AM  Result Value Ref Range Status   Specimen Description   Final    BLOOD LEFT ANTECUBITAL Performed at [facility], [address], [place], [place] [address]    Special Requests   Final    BOTTLES DRAWN AEROBIC AND ANAEROBIC Blood Culture adequate volume Performed at [facility], [address] [place], [place] [address]    Culture   Final    NO GROWTH 5 DAYS Performed at [facility], [address] [place], [place] [address]    Report Status 12/22/2018 FINAL  Final  Blood Culture (routine x 2)     Status: None   Collection Time: 12/17/18 10:49 AM  Result Value Ref Range Status   Specimen Description   Final    BLOOD RIGHT ANTECUBITAL Performed at [facility], [address], [place], [place] [address]    Special Requests   Final    BOTTLES DRAWN AEROBIC AND ANAEROBIC Blood Culture results may not be optimal due to an excessive volume of blood received in culture bottles Performed at [facility], [address], [place], [place] [address]    Culture   Final    NO GROWTH 5 DAYS Performed at [facility], [address], [place],  [place] [address]    Report Status 12/22/2018 FINAL  Final  Blood Culture (routine x 2)     Status: None (Preliminary result)   Collection Time: 12/21/18 10:36 PM  Result Value Ref Range Status   Specimen Description   Final    BLOOD RIGHT FOREARM Performed at [facility], [address], [place], [place] [address]    Special Requests   Final    BOTTLES DRAWN AEROBIC AND ANAEROBIC Blood Culture adequate volume Performed at [facility], [address], [place], [place] [address]    Culture   Final    NO GROWTH 2 DAYS Performed at [facility], [address], [place], [place] [address]    Report Status PENDING  Incomplete  Blood Culture (routine x 2)     Status: None (Preliminary result)   Collection Time: 12/21/18 10:36 PM  Result Value Ref Range Status   Specimen Description   Final    BLOOD LEFT FOREARM Performed at [facility], [address], [place], [place] [address]    Special Requests   Final    BOTTLES DRAWN AEROBIC AND ANAEROBIC Blood Culture adequate volume Performed at [facility], [address], [place], [place] [address]    Culture   Final    NO GROWTH 2 DAYS Performed at [facility], [address] [place], [place] [address]    Report Status PENDING  Incomplete  Group A Strep by PCR     Status: Abnormal   Collection Time: 12/22/18 11:30 AM  Result Value Ref Range Status   Group A Strep by PCR NEGATIVE (A) NOT DETECTED Final    Comment: Performed at [facility], [address], [place], [place] [address]      Radiology Studies: Ct Angio Chest Pe W Or Wo Contrast  Result Date: 12/22/2018 CLINICAL DATA:  Positive D-dimer.  Hypoxemia.  Positive for COVID-19 EXAM: CT ANGIOGRAPHY CHEST WITH CONTRAST TECHNIQUE: Multidetector CT imaging of the chest was performed using the standard protocol during bolus administration of intravenous contrast. Multiplanar CT image reconstructions and MIPs were  obtained to evaluate the vascular anatomy. CONTRAST:  OMNIPAQUE IOHEXOL 350 MG/ML SOLN COMPARISON:  Chest x-ray 12/21/2018 FINDINGS: Cardiovascular: Heart is mildly enlarged. Mild aneurysmal dilatation of the ascending thoracic aorta, 4.1 cm. No filling defects in the pulmonary arteries to suggest pulmonary emboli. Mediastinum/Nodes: No mediastinal, hilar, or axillary adenopathy. Lungs/Pleura: Patchy ground-glass opacities throughout both lungs, predominantly dependent but also noted in the apices. No effusions. This likely related to COVID-19. Upper Abdomen: Imaging into the upper abdomen shows no acute findings. Musculoskeletal: Chest wall soft tissues are unremarkable. No acute bony abnormality. Review of the MIP images confirms the above findings. IMPRESSION: Extensive patchy bilateral ground-glass airspace opacities, likely related to patient's known COVID-19. No evidence of pulmonary embolus. 4.1 cm ascending thoracic aortic aneurysm. Recommend annual imaging followup by CTA or MRA. This recommendation follows 2010 ACCF/AHA/AATS/ACR/ASA/SCA/SCAI/SIR/STS/SVM Guidelines for the Diagnosis and Management of Patients with Thoracic Aortic Disease. Circulation. 2010; 121: A834-H962. Aortic aneurysm NOS (ICD10-I71.9) Electronically Signed   By: Charlett Nose M.D.   On: 12/22/2018 02:42   Dg Chest Port 1 View  Result Date: 12/23/2018 CLINICAL DATA:  Shortness of breath.  COVID-19 EXAM: PORTABLE CHEST 1 VIEW COMPARISON:  CT from yesterday FINDINGS: Patchy bilateral airspace disease from viral pneumonia/ARDS. There is mild progression since yesterday, most convincing at the right upper lobe. No edema, effusion, or pneumothorax. Normal heart size. IMPRESSION: ARDS/pneumonia with mild worsening compared to yesterday. Electronically Signed   By: Marnee Spring M.D.   On: 12/23/2018 04:48   Dg Chest Port 1 View  Result Date: 12/21/2018 CLINICAL DATA:  Shortness of breath and cough EXAM: PORTABLE CHEST 1 VIEW  COMPARISON:  12/17/2018 FINDINGS: Bibasilar ground-glass opacities. These are increased as compared with 12/17/2018. Stable cardiomediastinal silhouette. No pneumothorax. IMPRESSION: Worsening bibasilar ground-glass opacity/pneumonias. Electronically Signed   By: Jasmine Pang M.D.   On: 12/21/2018 21:11    Scheduled Meds: . chlorhexidine  15 mL Mouth Rinse BID  . enoxaparin (LOVENOX) injection  40 mg Subcutaneous Q12H  .  mouth rinse  15 mL Mouth Rinse q12n4p  . methylPREDNISolone (SOLU-MEDROL) injection  80 mg Intravenous Q12H  . propranolol  40 mg Oral Daily  . sodium chloride flush  3 mL Intravenous Q12H  . sodium chloride flush  3 mL Intravenous Q12H  . vitamin C  500 mg Oral Daily  . zinc sulfate  220 mg Oral Daily   Continuous Infusions: . sodium chloride       LOS: 1 day   Time spent: 25 minutes.  Tyrone Nineyan B , MD [facility] [email or web address] Password [email or web address] 12/23/2018, 10:37 AM

## 2018-12-23 NOTE — Plan of Care (Signed)

## 2018-12-23 NOTE — Progress Notes (Signed)
Inpatient Diabetes Program Recommendations  AACE/ADA: New Consensus Statement on Inpatient Glycemic Control (2015)  Target Ranges:  Prepandial:   less than 140 mg/dL      Peak postprandial:   less than 180 mg/dL (1-2 hours)      Critically ill patients:  140 - 180 mg/dL   No results found for: GLUCAP, HGBA1C  Review of Glycemic Control Results for Cameron Hampton, Cameron Hampton (MRN [id number]) as of 12/23/2018 13:16  Ref. Range 12/22/2018 06:30 12/23/2018 04:33  Glucose Latest Ref Range: 70 - 99 mg/dL 563 (H) 893 (H)   Diabetes history: None noted  Solumedrol 80 mg IV q 12 hours Inpatient Diabetes Program Recommendations:    Note elevated lab glucose.  While on steroids, may consider adding Novolog moderate tid with meals and HS.   Thanks,  Beryl Meager, RN, BC-ADM Inpatient Diabetes Coordinator Pager [phone] (8a-5p)

## 2018-12-23 NOTE — Progress Notes (Addendum)
[id number] Marco interrupter used for assessment and education.  [id number] Luis interrupter used for prone education. Pt stated his whole life he as had trouble laying prone. But will try  Pt is mouth breather. Needs simple mask. Charge nurse aware.

## 2018-12-24 ENCOUNTER — Other Ambulatory Visit: Payer: Self-pay

## 2018-12-24 DIAGNOSIS — U071 COVID-19: Secondary | ICD-10-CM | POA: Diagnosis not present

## 2018-12-24 DIAGNOSIS — J069 Acute upper respiratory infection, unspecified: Secondary | ICD-10-CM | POA: Diagnosis not present

## 2018-12-24 DIAGNOSIS — J9601 Acute respiratory failure with hypoxia: Secondary | ICD-10-CM | POA: Diagnosis not present

## 2018-12-24 DIAGNOSIS — K851 Biliary acute pancreatitis without necrosis or infection: Secondary | ICD-10-CM | POA: Diagnosis not present

## 2018-12-24 DIAGNOSIS — I10 Essential (primary) hypertension: Secondary | ICD-10-CM | POA: Diagnosis not present

## 2018-12-24 DIAGNOSIS — J1289 Other viral pneumonia: Secondary | ICD-10-CM | POA: Diagnosis not present

## 2018-12-24 LAB — GLUCOSE, CAPILLARY
Glucose-Capillary: 351 mg/dL — ABNORMAL HIGH (ref 70–99)
Glucose-Capillary: 321 mg/dL — ABNORMAL HIGH (ref 70–99)

## 2018-12-24 LAB — FERRITIN: Ferritin: 2171 ng/mL — ABNORMAL HIGH (ref 24–336)

## 2018-12-24 LAB — CBC WITH DIFFERENTIAL/PLATELET
Abs Immature Granulocytes: 0.09 10*3/uL — ABNORMAL HIGH (ref 0.00–0.07)
Basophils Absolute: 0 10*3/uL (ref 0.0–0.1)
Basophils Relative: 0 %
Eosinophils Absolute: 0 10*3/uL (ref 0.0–0.5)
Eosinophils Relative: 0 %
HCT: 44.3 % (ref 39.0–52.0)
Hemoglobin: 14.7 g/dL (ref 13.0–17.0)
Immature Granulocytes: 1 %
Lymphocytes Relative: 3 %
Lymphs Abs: 0.5 10*3/uL — ABNORMAL LOW (ref 0.7–4.0)
MCH: 30 pg (ref 26.0–34.0)
MCHC: 33.2 g/dL (ref 30.0–36.0)
MCV: 90.4 fL (ref 80.0–100.0)
Monocytes Absolute: 0.5 10*3/uL (ref 0.1–1.0)
Monocytes Relative: 3 %
Neutro Abs: 13.8 10*3/uL — ABNORMAL HIGH (ref 1.7–7.7)
Neutrophils Relative %: 93 %
Platelets: 487 10*3/uL — ABNORMAL HIGH (ref 150–400)
RBC: 4.9 MIL/uL (ref 4.22–5.81)
RDW: 13 % (ref 11.5–15.5)
WBC: 14.8 10*3/uL — ABNORMAL HIGH (ref 4.0–10.5)
nRBC: 0 % (ref 0.0–0.2)

## 2018-12-24 LAB — TROPONIN I
Troponin I: 0.16 ng/mL (ref <0.03)
Troponin I: 0.17 ng/mL (ref <0.03)

## 2018-12-24 LAB — COMPREHENSIVE METABOLIC PANEL
ALT: 130 U/L — ABNORMAL HIGH (ref 0–44)
AST: 91 U/L — ABNORMAL HIGH (ref 15–41)
Albumin: 2.7 g/dL — ABNORMAL LOW (ref 3.5–5.0)
Alkaline Phosphatase: 181 U/L — ABNORMAL HIGH (ref 38–126)
Anion gap: 11 (ref 5–15)
BUN: 23 mg/dL — ABNORMAL HIGH (ref 6–20)
CO2: 28 mmol/L (ref 22–32)
Calcium: 8.7 mg/dL — ABNORMAL LOW (ref 8.9–10.3)
Chloride: 100 mmol/L (ref 98–111)
Creatinine, Ser: 0.64 mg/dL (ref 0.61–1.24)
GFR calc Af Amer: >60 mL/min (ref >60)
GFR calc non Af Amer: >60 mL/min (ref >60)
Glucose, Bld: 239 mg/dL — ABNORMAL HIGH (ref 70–99)
Potassium: 4.2 mmol/L (ref 3.5–5.1)
Sodium: 139 mmol/L (ref 135–145)
Total Bilirubin: 0.6 mg/dL (ref 0.3–1.2)
Total Protein: 6.5 g/dL (ref 6.5–8.1)

## 2018-12-24 LAB — LIPASE, BLOOD: Lipase: 46 U/L (ref 11–51)

## 2018-12-24 LAB — MAGNESIUM: Magnesium: 2.2 mg/dL (ref 1.7–2.4)

## 2018-12-24 LAB — C-REACTIVE PROTEIN: CRP: 8.4 mg/dL — ABNORMAL HIGH (ref <1.0)

## 2018-12-24 LAB — D-DIMER, QUANTITATIVE (NOT AT ARMC): D-Dimer, Quant: 0.89 ug/mL-FEU — ABNORMAL HIGH (ref 0.00–0.50)

## 2018-12-24 MED ORDER — INSULIN ASPART 100 UNIT/ML ~~LOC~~ SOLN
0.0000 [IU] | Freq: Three times a day (TID) | SUBCUTANEOUS | Status: DC
  Administered 2018-12-24 – 2018-12-25 (×2): 15 [IU] via SUBCUTANEOUS
  Administered 2018-12-25: 09:00:00 5 [IU] via SUBCUTANEOUS
  Administered 2018-12-25: 18:00:00 8 [IU] via SUBCUTANEOUS
  Administered 2018-12-26 – 2018-12-27 (×2): 5 [IU] via SUBCUTANEOUS
  Administered 2018-12-27: 09:00:00 2 [IU] via SUBCUTANEOUS
  Administered 2018-12-27: 12:00:00 3 [IU] via SUBCUTANEOUS
  Administered 2018-12-28: 18:00:00 2 [IU] via SUBCUTANEOUS
  Administered 2018-12-28: 13:00:00 3 [IU] via SUBCUTANEOUS
  Administered 2018-12-28: 09:00:00 2 [IU] via SUBCUTANEOUS
  Administered 2018-12-29: 13:00:00 3 [IU] via SUBCUTANEOUS
  Administered 2018-12-29 – 2018-12-30 (×2): 2 [IU] via SUBCUTANEOUS
  Administered 2018-12-30: 18:00:00 3 [IU] via SUBCUTANEOUS

## 2018-12-24 MED ORDER — ASPIRIN 325 MG PO TABS
325.0000 mg | ORAL_TABLET | Freq: Every day | ORAL | Status: DC
  Filled 2018-12-24 (×2): qty 1

## 2018-12-24 MED ORDER — LIDOCAINE VISCOUS HCL 2 % MT SOLN
15.0000 mL | Freq: Once | OROMUCOSAL | Status: AC
  Administered 2018-12-24: 15 mL via ORAL
  Filled 2018-12-24: qty 15

## 2018-12-24 MED ORDER — ALUM & MAG HYDROXIDE-SIMETH 200-200-20 MG/5ML PO SUSP
30.0000 mL | Freq: Once | ORAL | Status: AC
  Administered 2018-12-24: 30 mL via ORAL
  Filled 2018-12-24: qty 30

## 2018-12-24 MED ORDER — NITROGLYCERIN 0.4 MG SL SUBL: 0.4000 mg | SUBLINGUAL_TABLET | SUBLINGUAL | Status: DC | PRN

## 2018-12-24 MED ORDER — ASPIRIN EC 325 MG PO TBEC
325.0000 mg | DELAYED_RELEASE_TABLET | Freq: Every day | ORAL | Status: DC
  Administered 2018-12-24 – 2018-12-28 (×5): 325 mg via ORAL
  Filled 2018-12-24 (×5): qty 1

## 2018-12-24 MED ORDER — INSULIN ASPART 100 UNIT/ML ~~LOC~~ SOLN
0.0000 [IU] | Freq: Every day | SUBCUTANEOUS | Status: DC
  Administered 2018-12-24: 22:00:00 4 [IU] via SUBCUTANEOUS
  Administered 2018-12-25: 23:00:00 3 [IU] via SUBCUTANEOUS
  Administered 2018-12-26 – 2018-12-27 (×2): 2 [IU] via SUBCUTANEOUS

## 2018-12-24 NOTE — Plan of Care (Signed)
  Problem: Education: Goal: Knowledge of General Education information will improve Description Including pain rating scale, medication(s)/side effects and non-pharmacologic comfort measures Outcome: Progressing   Problem: Health Behavior/Discharge Planning: Goal: Ability to manage health-related needs will improve Outcome: Progressing   Problem: Clinical Measurements: Goal: Ability to maintain clinical measurements within normal limits will improve Outcome: Progressing Goal: Diagnostic test results will improve Outcome: Progressing Goal: Respiratory complications will improve Outcome: Progressing   Problem: Activity: Goal: Risk for activity intolerance will decrease Outcome: Progressing   Problem: Nutrition: Goal: Adequate nutrition will be maintained Outcome: Progressing   Problem: Elimination: Goal: Will not experience complications related to bowel motility Outcome: Progressing Goal: Will not experience complications related to urinary retention Outcome: Progressing   Problem: Safety: Goal: Ability to remain free from injury will improve Outcome: Progressing   Problem: Skin Integrity: Goal: Risk for impaired skin integrity will decrease Outcome: Progressing   Problem: Education: Goal: Knowledge of risk factors and measures for prevention of condition will improve Outcome: Progressing   Problem: Coping: Goal: Psychosocial and spiritual needs will be supported Outcome: Progressing   Problem: Respiratory: Goal: Will maintain a patent airway Outcome: Progressing Goal: Complications related to the disease process, condition or treatment will be avoided or minimized Outcome: Progressing   Problem: Clinical Measurements: Goal: Cardiovascular complication will be avoided Outcome: Not Progressing Note:  Episode of chest pain, bradycardia   Problem: Coping: Goal: Level of anxiety will decrease Outcome: Not Progressing Note:  Anxious today d/t chest pain  episode

## 2018-12-24 NOTE — Progress Notes (Addendum)
PROGRESS NOTE  Ivy Pasquini  VWU:[id number] DOB: Sep 25, 1965 DOA: 12/21/2018 PCP: None Brief Narrative: Mustafa Berent is a 53 y.o. male with a history of HTN who was exposed to his wife who has a positive COVID-19 infection about 2 weeks ago comes in with fever, sore throat, diarrhea, extreme shortness of breath.  Diagnosed with COVID-19 pneumonia and hypoxic respiratory failure and admitted to [facility]. Tocilizumab given 5/7.  Assessment & Plan: Active Problems:   Acute respiratory disease due to COVID-19 virus   Essential hypertension  Acute hypoxic respiratory failure due to covid-19 pneumonia: Remains hypoxic at rest in 86-88% despite nasal cannula oxygen, appears comfortable. Given typical course of illness, should be convalescing soon. - Continue lovenox  [place] BID - s/p tocilizumab 5/7. - LFTs and WBC up with actemra and steroids. Will not repeat toci today with precipitously downtrending  CRP and overall improvement in inflammatory markers, though he remains at high risk of decompensation.  - Repeat CXR in AM.  - Solumedrol  IV q12h to continue for now.  - A f/u visit has been scheduled at [facility] on 5/18 by CM.  - Has always been a mouthbreather, can use face mask for O2 delivery. Giving afrin x3 days. Prone as able, IS as much as possible to encourage deep breathing. Discussed w/pt daily. - Monitor blood cultures. s/p CTX, azithromycin 5/6.  Pharyngitis: Due to covid/cough. Rapid strep (GAS) and Strep Ag (Pneumococcus) negative. - Antitussives, chloraseptic spray.   HTN:  - Continue propranolol  Sinus bradycardia: Mostly while sleeping, otherwise NSR and asymptomatic. No pauses noted. - Continue propranolol and telemetry.  Thoracic aortic aneurysm: Incidentally noted at 4.1cm. - Continue beta blocker - Recommend annual imaging followup by CTA or MRA.  History of pancreatitis: Suspected to be due to gallstones, no Hx alcohol use.  - Monitor  Steroid-induced  hyperglycemia:  - Check HbA1c in AM - Start mod SSI, HS coverage.   DVT prophylaxis: Lovenox as above Code Status: Full Family Communication: None at bedside Disposition Plan: Uncertain, remains significantly hypoxic, likely home once improving from respiratory standpoint.  Consultants:   None  Procedures:   Tocilizumab 12/22/2018   Antimicrobials:  Ceftriaxone, azithromycin 5/6   Subjective: Feels he's breathing marginally better today, not short of breath at rest, but he is with exertion. Really wants to get up and walk around more. No fevers, chills, abd pain, N/V/D. Eating better. Says he's comfortable but in 80%'s while speaking, states he's a mouth breather and doesn't think he's getting O2 from [place].  Spanish video interpretor Gio used throughout encounter.   Objective: Vitals:   12/24/18 0103 12/24/18 0107 12/24/18 0422 12/24/18 0848  BP:  135/77 (!) 136/99 125/81  Pulse: (!) 45 (!) 48 (!) 56 71  Resp: (!) 23 20 (!) 25 (!) 26  Temp:   99.1 F (37.3 C) 98.2 F (36.8 C)  TempSrc:   Oral Oral  SpO2: 95% 94% (!) 89% 93%  Weight:      Height:        Intake/Output Summary (Last 24 hours) at 12/24/2018 1046 Last data filed at 12/24/2018 0852 Gross per 24 hour  Intake 360 ml  Output 1425 ml  Net -1065 ml   Filed Weights   12/21/18 2044 12/22/18 0200  Weight: 87.1 kg 78.7 kg   Gen: 53 y.o. male in no distress Pulm: Nonlabored breathing 4L by . Appears very comfortable but is in high 80%'s during conversation. Coarse. CV: Regular borderline bradycardia. No murmur, rub, or  gallop. No JVD, no dependent edema. GI: Abdomen soft, non-tender, non-distended, with normoactive bowel sounds.  Ext: Warm, no deformities Skin: No new rashes, lesions or ulcers on visualized skin. Neuro: Alert and oriented. No focal neurological deficits. Psych: Judgement and insight appear fair. Mood euthymic & affect congruent. Behavior is appropriate.    Data Reviewed: I have personally  reviewed following labs and imaging studies  CBC: Recent Labs  Lab 12/21/18 2236 12/22/18 0630 12/23/18 0433 12/24/18 0449  WBC 10.2 9.5 5.3 14.8*  NEUTROABS 8.6* 7.7 4.5 13.8*  HGB 16.1 14.9 14.6 14.7  HCT 47.8 44.9 44.7 44.3  MCV 89.3 90.3 90.5 90.4  PLT 396 400 494* 487*   Basic Metabolic Panel: Recent Labs  Lab 12/21/18 2236 12/22/18 0630 12/23/18 0433 12/24/18 0449  NA 133* 137 138 139  K 3.9 3.8 4.0 4.2  CL 98 102 103 100  CO2 25 26 24 28   GLUCOSE 141* 116* 232* 239*  BUN 11 13 23* 23*  CREATININE 0.54* 0.64 0.67 0.64  CALCIUM 8.3* 8.1* 8.8* 8.7*  MG  --  2.1 2.3 2.2   GFR: Estimated Creatinine Clearance: 115.9 mL/min (by C-G formula based on SCr of 0.64 mg/dL). Liver Function Tests: Recent Labs  Lab 12/21/18 2236 12/22/18 0630 12/23/18 0433 12/24/18 0449  AST 53* 44* 75* 91*  ALT 50* 43 83* 130*  ALKPHOS 166* 151* 189* 181*  BILITOT 0.6 0.8 0.7 0.6  PROT 7.0 6.6 6.9 6.5  ALBUMIN 2.8* 2.8* 2.8* 2.7*   Recent Labs  Lab 12/21/18 2236  LIPASE 37   Lipid Profile: Recent Labs    12/21/18 2236  TRIG 132   Anemia Panel: Recent Labs    12/23/18 0433 12/24/18 0449  FERRITIN 2,293* 2,171*   Recent Results (from the past 240 hour(s))  SARS Coronavirus 2 [facility] order, Performed in [facility] hospital lab)     Status: Abnormal   Collection Time: 12/17/18  9:19 AM  Result Value Ref Range Status   SARS Coronavirus 2 POSITIVE (A) NEGATIVE Final    Comment: RESULT CALLED TO, READ BACK BY AND VERIFIED WITH: P.DOWD,RN [id number] @1235  BY V.WILKINS (NOTE) If result is NEGATIVE SARS-CoV-2 target nucleic acids are NOT DETECTED. The SARS-CoV-2 RNA is generally detectable in upper and lower  respiratory specimens during the acute phase of infection. The lowest  concentration of SARS-CoV-2 viral copies this assay can detect is 250  copies / mL. A negative result does not preclude SARS-CoV-2 infection  and should not be used as the sole basis for treatment  or other  patient management decisions.  A negative result may occur with  improper specimen collection / handling, submission of specimen other  than nasopharyngeal swab, presence of viral mutation(s) within the  areas targeted by this assay, and inadequate number of viral copies  (<250 copies / mL). A negative result must be combined with clinical  observations, patient history, and epidemiological information. If result is POSITIVE SARS-CoV-2 target nucleic acids are DETECTED. T he SARS-CoV-2 RNA is generally detectable in upper and lower  respiratory specimens during the acute phase of infection.  Positive  results are indicative of active infection with SARS-CoV-2.  Clinical  correlation with patient history and other diagnostic information is  necessary to determine patient infection status.  Positive results do  not rule out bacterial infection or co-infection with other viruses. If result is PRESUMPTIVE POSTIVE SARS-CoV-2 nucleic acids MAY BE PRESENT.   A presumptive positive result was obtained on the submitted  specimen  and confirmed on repeat testing.  While 2019 novel coronavirus  (SARS-CoV-2) nucleic acids may be present in the submitted sample  additional confirmatory testing may be necessary for epidemiological  and / or clinical management purposes  to differentiate between  SARS-CoV-2 and other Sarbecovirus currently known to infect humans.  If clinically indicated additional testing with an alternate test  methodology [phone]([id number]) is  advised. The SARS-CoV-2 RNA is generally  detectable in upper and lower respiratory specimens during the acute  phase of infection. The expected result is Negative. Fact Sheet for Patients:  BoilerBrush.com.cyhttps://www.fda.gov/media/136312/download Fact Sheet for Healthcare Providers: https://pope.com/https://www.fda.gov/media/136313/download This test is not yet approved or cleared by the [place] FDA and has been authorized for detection and/or diagnosis of  SARS-CoV-2 by FDA under an Emergency Use Authorization (EUA).  This EUA will remain in effect (meaning this test can be used) for the duration of the COVID-19 declaration under Section 564(b)(1) of the Act, 21 U.S.C. section 360bbb-3(b)(1), unless the authorization is terminated or revoked sooner. Performed at [facility], [address] [place], [place] [address]   Blood Culture (routine x 2)     Status: None   Collection Time: 12/17/18  9:19 AM  Result Value Ref Range Status   Specimen Description   Final    BLOOD LEFT ANTECUBITAL Performed at [facility], [address] [place], [place] [address]    Special Requests   Final    BOTTLES DRAWN AEROBIC AND ANAEROBIC Blood Culture adequate volume Performed at [facility], [address] [place], [place] [address]    Culture   Final    NO GROWTH 5 DAYS Performed at [facility], [address] [place], [place] [address]    Report Status 12/22/2018 FINAL  Final  Blood Culture (routine x 2)     Status: None   Collection Time: 12/17/18 10:49 AM  Result Value Ref Range Status   Specimen Description   Final    BLOOD RIGHT ANTECUBITAL Performed at [facility], [address] [place], [place] [address]    Special Requests   Final    BOTTLES DRAWN AEROBIC AND ANAEROBIC Blood Culture results may not be optimal due to an excessive volume of blood received in culture bottles Performed at [facility], [address] [place], [place] [address]    Culture   Final    NO GROWTH 5 DAYS Performed at [facility], [address] [place], [place] [address]    Report Status 12/22/2018 FINAL  Final  Blood Culture (routine x 2)     Status: None (Preliminary result)   Collection Time: 12/21/18 10:36 PM  Result Value Ref Range Status   Specimen Description   Final    BLOOD RIGHT FOREARM Performed at [facility], [address]  [place], [place] [address]    Special Requests   Final    BOTTLES DRAWN AEROBIC AND ANAEROBIC Blood Culture adequate volume Performed at [facility], [address] [place], [place] [address]    Culture   Final    NO GROWTH 2 DAYS Performed at [facility], [address] [place], [place] [address]    Report Status PENDING  Incomplete  Blood Culture (routine x 2)     Status: None (Preliminary result)   Collection Time: 12/21/18 10:36 PM  Result Value Ref Range Status   Specimen Description  Final    BLOOD LEFT FOREARM Performed at [facility], [address] [place], [place] [address]    Special Requests   Final    BOTTLES DRAWN AEROBIC AND ANAEROBIC Blood Culture adequate volume Performed at [facility], [address] [place], [place] [address]    Culture   Final    NO GROWTH 2 DAYS Performed at [facility], [address] [place], [place] [address]    Report Status PENDING  Incomplete  Group A Strep by PCR     Status: Abnormal   Collection Time: 12/22/18 11:30 AM  Result Value Ref Range Status   Group A Strep by PCR NEGATIVE (A) NOT DETECTED Final    Comment: Performed at [facility], [address] [place], [place] [address]      Radiology Studies: Dg Chest Port 1 View  Result Date: 12/23/2018 CLINICAL DATA:  Shortness of breath.  COVID-19 EXAM: PORTABLE CHEST 1 VIEW COMPARISON:  CT from yesterday FINDINGS: Patchy bilateral airspace disease from viral pneumonia/ARDS. There is mild progression since yesterday, most convincing at the right upper lobe. No edema, effusion, or pneumothorax. Normal heart size. IMPRESSION: ARDS/pneumonia with mild worsening compared to yesterday. Electronically Signed   By: Marnee Spring M.D.   On: 12/23/2018 04:48    Scheduled Meds: . chlorhexidine  15 mL Mouth Rinse BID  . enoxaparin (LOVENOX) injection  40 mg Subcutaneous Q12H  . mouth rinse  15 mL Mouth Rinse q12n4p  .  methylPREDNISolone (SOLU-MEDROL) injection  80 mg Intravenous Q12H  . oxymetazoline  1 spray Each Nare BID  . propranolol  40 mg Oral Daily  . sodium chloride flush  3 mL Intravenous Q12H  . sodium chloride flush  3 mL Intravenous Q12H  . vitamin C  500 mg Oral Daily  . zinc sulfate  220 mg Oral Daily   Continuous Infusions: . sodium chloride Stopped (12/23/18 1400)     LOS: 2 days   Time spent: 35 minutes.  Tyrone Nine, MD [facility] [email or web address] Password [email or web address] 12/24/2018, 10:46 AM

## 2018-12-24 NOTE — Progress Notes (Signed)
Interim progress note  S: Called by RN for severe sharp chest pain that is similar in character to his previous bouts of idiopathic pancreatitis. This came on suddenly while eating this afternoon, did not radiate, and eased off gradually over 10-15 minutes. When I arrive for interview he has no chest pain, no increased dyspnea, no nausea, vomiting or abdominal pain. Thinks he may have gotten benefit/improvement from GI cocktail. He actually points just below the xiphoid process to localize where the pain was.  O: No distress sitting watching television RRR, no JVD or edema. Pectus excavatum.  Nonlabored and clear Soft, not significantly tender with attention in epigastrium, nondistended. No jaundice or rashes  ECG personally reviewed on chart demonstrating sinus rhythm with LAFB, no PR prolongation or ST elevations or depressions. Nonspecific t-waves diffusely which are similar to prior.   Troponin elevated to 0.16 without prior for comparison. Lipase is wnl.  A/P:   Troponin elevation, lower chest/epigastric pain: Onset while eating more concerning for intraabdominal inflammation than cardiac ischemia. He has no known history of CAD and no coronary calcifications on CTA chest. Benign abdominal exam and normal lipase. LFTs did recently increase, though in the setting of viral pneumonia and actemra and normal bilirubin do not suspect obstructive process.  - Continue lovenox 40mg  [place] BID. No escalation given resolution of chest pain and no evolving ischemic ECG changes.  - Started aspirin, will continue daily.  - Continue oxygen - NTG prn chest pain - Morphine prn chest pain - Pt is on beta blocker - Check lipid panel in AM - Trend troponin  Hazeline Junker, MD 12/24/2018 5:05 PM

## 2018-12-24 NOTE — Progress Notes (Addendum)
1403  Sitting up in chair.  States chest pain is much better now.  4/10 on scale.  GI cocktail given.   1436  Lab called troponin 0.16.  Voice mail left for md to return call  1449  Md returned call.  Orders received.    1459  Assisted back to bed.   ASA given and EKG complete.  Placed on paper chart.   Rates chest pain 4/10.  Denies any tightness or radiating pain.    1600  Resting in bed.   Rates chest pain 0/10

## 2018-12-24 NOTE — Progress Notes (Addendum)
RN to assess pt after pt calling out with chest pain. Sharp pain 9/10 mid chest "It feels just like it did with my pancreatitis". Pt vitals otherwise stable. MD made aware of sustained chest pain, orders placed. Patient updated on plan of care.

## 2018-12-25 ENCOUNTER — Other Ambulatory Visit: Payer: Self-pay

## 2018-12-25 ENCOUNTER — Inpatient Hospital Stay (HOSPITAL_COMMUNITY): Payer: Medicaid Other

## 2018-12-25 DIAGNOSIS — U071 COVID-19: Secondary | ICD-10-CM | POA: Diagnosis not present

## 2018-12-25 DIAGNOSIS — K851 Biliary acute pancreatitis without necrosis or infection: Secondary | ICD-10-CM | POA: Diagnosis not present

## 2018-12-25 DIAGNOSIS — J069 Acute upper respiratory infection, unspecified: Secondary | ICD-10-CM | POA: Diagnosis not present

## 2018-12-25 DIAGNOSIS — J1289 Other viral pneumonia: Secondary | ICD-10-CM | POA: Diagnosis not present

## 2018-12-25 DIAGNOSIS — I10 Essential (primary) hypertension: Secondary | ICD-10-CM | POA: Diagnosis not present

## 2018-12-25 DIAGNOSIS — J9601 Acute respiratory failure with hypoxia: Secondary | ICD-10-CM | POA: Diagnosis not present

## 2018-12-25 LAB — CBC WITH DIFFERENTIAL/PLATELET
Abs Immature Granulocytes: 0.07 10*3/uL (ref 0.00–0.07)
Basophils Absolute: 0 10*3/uL (ref 0.0–0.1)
Basophils Relative: 0 %
Eosinophils Absolute: 0 10*3/uL (ref 0.0–0.5)
Eosinophils Relative: 0 %
HCT: 46.5 % (ref 39.0–52.0)
Hemoglobin: 15 g/dL (ref 13.0–17.0)
Immature Granulocytes: 1 %
Lymphocytes Relative: 4 %
Lymphs Abs: 0.5 10*3/uL — ABNORMAL LOW (ref 0.7–4.0)
MCH: 29.5 pg (ref 26.0–34.0)
MCHC: 32.3 g/dL (ref 30.0–36.0)
MCV: 91.4 fL (ref 80.0–100.0)
Monocytes Absolute: 0.6 10*3/uL (ref 0.1–1.0)
Monocytes Relative: 4 %
Neutro Abs: 11.3 10*3/uL — ABNORMAL HIGH (ref 1.7–7.7)
Neutrophils Relative %: 91 %
Platelets: 566 10*3/uL — ABNORMAL HIGH (ref 150–400)
RBC: 5.09 MIL/uL (ref 4.22–5.81)
RDW: 12.7 % (ref 11.5–15.5)
WBC: 12.5 10*3/uL — ABNORMAL HIGH (ref 4.0–10.5)
nRBC: 0 % (ref 0.0–0.2)

## 2018-12-25 LAB — COMPREHENSIVE METABOLIC PANEL
ALT: 252 U/L — ABNORMAL HIGH (ref 0–44)
AST: 26 U/L (ref 15–41)
Albumin: 2.8 g/dL — ABNORMAL LOW (ref 3.5–5.0)
Alkaline Phosphatase: 206 U/L — ABNORMAL HIGH (ref 38–126)
Anion gap: 13 (ref 5–15)
BUN: 23 mg/dL — ABNORMAL HIGH (ref 6–20)
CO2: 24 mmol/L (ref 22–32)
Calcium: 8.8 mg/dL — ABNORMAL LOW (ref 8.9–10.3)
Chloride: 101 mmol/L (ref 98–111)
Creatinine, Ser: 0.58 mg/dL — ABNORMAL LOW (ref 0.61–1.24)
GFR calc Af Amer: >60 mL/min (ref >60)
GFR calc non Af Amer: >60 mL/min (ref >60)
Glucose, Bld: 279 mg/dL — ABNORMAL HIGH (ref 70–99)
Potassium: 4.8 mmol/L (ref 3.5–5.1)
Sodium: 138 mmol/L (ref 135–145)
Total Bilirubin: 2.5 mg/dL — ABNORMAL HIGH (ref 0.3–1.2)
Total Protein: 6.5 g/dL (ref 6.5–8.1)

## 2018-12-25 LAB — TROPONIN I
Troponin I: 0.23 ng/mL (ref <0.03)
Troponin I: 0.29 ng/mL (ref <0.03)

## 2018-12-25 LAB — LIPID PANEL
Cholesterol: 172 mg/dL (ref 0–200)
HDL: 33 mg/dL — ABNORMAL LOW (ref >40)
LDL Cholesterol: 85 mg/dL (ref 0–99)
Total CHOL/HDL Ratio: 5.2 RATIO
Triglycerides: 269 mg/dL — ABNORMAL HIGH (ref <150)
VLDL: 54 mg/dL — ABNORMAL HIGH (ref 0–40)

## 2018-12-25 LAB — FERRITIN: Ferritin: 1808 ng/mL — ABNORMAL HIGH (ref 24–336)

## 2018-12-25 LAB — C-REACTIVE PROTEIN: CRP: 4.9 mg/dL — ABNORMAL HIGH (ref <1.0)

## 2018-12-25 LAB — HEMOGLOBIN A1C
Hgb A1c MFr Bld: 7.5 % — ABNORMAL HIGH (ref 4.8–5.6)
Mean Plasma Glucose: 168.55 mg/dL

## 2018-12-25 LAB — GLUCOSE, CAPILLARY
Glucose-Capillary: 232 mg/dL — ABNORMAL HIGH (ref 70–99)
Glucose-Capillary: 356 mg/dL — ABNORMAL HIGH (ref 70–99)
Glucose-Capillary: 313 mg/dL — ABNORMAL HIGH (ref 70–99)
Glucose-Capillary: 271 mg/dL — ABNORMAL HIGH (ref 70–99)

## 2018-12-25 LAB — D-DIMER, QUANTITATIVE: D-Dimer, Quant: 0.56 ug/mL-FEU — ABNORMAL HIGH (ref 0.00–0.50)

## 2018-12-25 LAB — BILIRUBIN, FRACTIONATED(TOT/DIR/INDIR)
Bilirubin, Direct: 1.3 mg/dL — ABNORMAL HIGH (ref 0.0–0.2)
Indirect Bilirubin: 0.8 mg/dL (ref 0.3–0.9)
Total Bilirubin: 2.1 mg/dL — ABNORMAL HIGH (ref 0.3–1.2)

## 2018-12-25 LAB — LIPASE, BLOOD: Lipase: 38 U/L (ref 11–51)

## 2018-12-25 MED ORDER — INSULIN GLARGINE 100 UNIT/ML ~~LOC~~ SOLN
7.0000 [IU] | Freq: Two times a day (BID) | SUBCUTANEOUS | Status: DC
  Filled 2018-12-25: qty 0.07

## 2018-12-25 MED ORDER — INSULIN GLARGINE 100 UNIT/ML ~~LOC~~ SOLN
10.0000 [IU] | Freq: Two times a day (BID) | SUBCUTANEOUS | Status: DC
  Administered 2018-12-25 – 2018-12-26 (×2): 10 [IU] via SUBCUTANEOUS
  Filled 2018-12-25 (×2): qty 0.1

## 2018-12-25 MED ORDER — INSULIN GLARGINE 100 UNIT/ML ~~LOC~~ SOLN
5.0000 [IU] | Freq: Every day | SUBCUTANEOUS | Status: DC
  Administered 2018-12-25: 11:00:00 5 [IU] via SUBCUTANEOUS
  Filled 2018-12-25: qty 0.05

## 2018-12-25 MED ORDER — INSULIN ASPART 100 UNIT/ML ~~LOC~~ SOLN: 5.0000 [IU] | Freq: Three times a day (TID) | SUBCUTANEOUS | Status: DC

## 2018-12-25 MED ORDER — INSULIN ASPART 100 UNIT/ML ~~LOC~~ SOLN
5.0000 [IU] | Freq: Three times a day (TID) | SUBCUTANEOUS | Status: DC
  Administered 2018-12-25: 18:00:00 5 [IU] via SUBCUTANEOUS

## 2018-12-25 NOTE — Progress Notes (Addendum)
PROGRESS NOTE  Cameron Hampton  ZOX:[id number] DOB: 1965-12-26 DOA: 12/21/2018 PCP: None Brief Narrative: Cameron Hampton is a 53 y.o. male with a history of HTN who was exposed to his wife who has a positive COVID-19 infection about 2 weeks ago comes in with fever, sore throat, diarrhea, extreme shortness of breath.  Diagnosed with COVID-19 pneumonia and hypoxic respiratory failure and admitted to [facility]. Tocilizumab given 5/7.  Assessment & Plan: Active Problems:   Acute respiratory disease due to COVID-19 virus   Essential hypertension  Acute hypoxic respiratory failure due to covid-19 pneumonia: Given typical course of illness, should be convalescing soon, hypoxia seems to be resolved even with exertion, CXR this AM personally reviewed showing interval decreased in airspace opacities, though continues to have patchy infiltrates. - Continue lovenox  [place] BID - s/p tocilizumab 5/7. - Stop steroids with improvement in respiratory symptoms and worsening hyperglycemia.  - Mobilize and prone as able, IS as much as possible to encourage deep breathing. Discussed w/pt daily. - Monitor blood cultures. s/p CTX, azithromycin 5/6.  Elevated LFTs: ALT predominance, now >5x ULN with concomitant rise of bilirubin. Concern for hepatotoxicity from actemra is possible.  - With hx pancreatitis, recheck lipase, fractionated bilirubin. - RUQ U/S - Hepatitis panel  Pharyngitis: Due to covid/cough. Rapid strep (GAS) and Strep Ag (Pneumococcus) negative. - Antitussives, chloraseptic spray.   HTN:  - Hold propranolol  Sinus bradycardia: Mostly while sleeping, otherwise NSR and asymptomatic. He is having intermittent sinus pauses on telemetry.  - Stop beta blocker and monitor.   Thoracic aortic aneurysm: Incidentally noted at 4.1cm. - Continue beta blocker - Recommend annual imaging followup by CTA or MRA.  History of pancreatitis: Suspected to be due to gallstones, no Hx alcohol use. Now with  epigastric/lower chest pain that is atypical for coronary syndrome. - Lipase normal yesterday, though LFTs trending up. Recheck lipase.   Troponin elevation: Features of pain are atypical for cardiac etiology and troponin is not trending up significantly. This is well-documented in severe courses of covid, though he does have some rhythm disturbances (sinus bradycardia) and LAFB and nonspecific t-wave changes with unclear etiology.  - Continue telemetry - Has been on intermediate dose lovenox due to well-documented prothrombotic state of covid. In the setting of relatively flat troponin, decreasing d-dimer, clinical improvement, no active chest pain and no ST elevations, will not change dosing now. - Started aspirin daily - prn NTG, morphine.  - With LFT's rising, not giving statin currently. LDL 85.  - Discussed with cardiology, Dr. Antoine Poche.  Steroid-induced hyperglycemia and T2DM: HbA1c 7.5% indicating longer term hyperglycemia.  - Stop steroids - Continue moderate SSI, HS coverage, add lantus, to expedite titration, will order BID.  DVT prophylaxis: Lovenox as above Code Status: Full Family Communication: None at bedside Disposition Plan: Uncertain, pending clinical improvement.  Consultants:   None  Procedures:   Tocilizumab 12/22/2018   Antimicrobials:  Ceftriaxone, azithromycin 5/6   Subjective: Walked with RN today and felt no chest pain, dyspnea significantly improved. Had abdominal/lower chest pain again once this morning that was 6/10 in intensity, sharp in character, abrupt in onset and resolved spontaneously. He was laying in bed supine at the time, had no associated dyspnea, N/V/D. Was not exerting himself or eating/drinking. Spanish video interpretor used throughout encounter, Franky Macho [phone].  Objective: Vitals:   12/24/18 2344 12/25/18 0425 12/25/18 0815 12/25/18 0900  BP: (!) 151/89  (!) 152/90 (!) 150/90  Pulse: 61  64   Resp:   Marland Kitchen)  26   Temp: 98.1 F (36.7  C) 97.9 F (36.6 C)  98 F (36.7 C)  TempSrc: Oral Oral    SpO2: 97%  98%   Weight:      Height:        Intake/Output Summary (Last 24 hours) at 12/25/2018 1106 Last data filed at 12/25/2018 0900 Gross per 24 hour  Intake 530 ml  Output 1475 ml  Net -945 ml   Filed Weights   12/21/18 2044 12/22/18 0200  Weight: 87.1 kg 78.7 kg   Gen: 54 y.o. male in no distress Pulm: Nonlabored breathing, 100% on nasal cannula at rest. Improving aeration, some non-dependent crackles bilaterally. . CV: Regular borderline bradeycardia. No murmur, rub, or gallop. No JVD, no dependent edema. GI: Abdomen soft, not appreciably tender in epigastrium or RUQ, negative Murphy's, non-distended, with normoactive bowel sounds.  Ext: Warm, no deformities Skin: No rashes, lesions or ulcers on visualized skin. Neuro: Alert and oriented. No focal neurological deficits. Psych: Judgement and insight appear fair. Mood euthymic & affect congruent. Behavior is appropriate.    Data Reviewed: I have personally reviewed following labs and imaging studies         CBC: Recent Labs  Lab 12/21/18 2236 12/22/18 0630 12/23/18 0433 12/24/18 0449 12/25/18 0115  WBC 10.2 9.5 5.3 14.8* 12.5*  NEUTROABS 8.6* 7.7 4.5 13.8* 11.3*  HGB 16.1 14.9 14.6 14.7 15.0  HCT 47.8 44.9 44.7 44.3 46.5  MCV 89.3 90.3 90.5 90.4 91.4  PLT 396 400 494* 487* 566*   Basic Metabolic Panel: Recent Labs  Lab 12/21/18 2236 12/22/18 0630 12/23/18 0433 12/24/18 0449 12/25/18 0925  NA 133* 137 138 139 138  K 3.9 3.8 4.0 4.2 4.8  CL 98 102 103 100 101  CO2 GLUCOSE 141* 116* 232* 239* 279*  BUN 11 13 23* 23* 23*  CREATININE 0.54* 0.64 0.67 0.64 0.58*  CALCIUM 8.3* 8.1* 8.8* 8.7* 8.8*  MG  --  2.1 2.3 2.2  --    GFR: Estimated Creatinine Clearance: 115.9 mL/min (A) (by C-G formula based on SCr of 0.58 mg/dL (L)). Liver Function Tests: Recent Labs  Lab 12/21/18 2236 12/22/18 0630 12/23/18 0433 12/24/18 0449  12/25/18 0925  AST 53* 44* 75* 91* 26  ALT 50* 43 83* 130* 252*  ALKPHOS 166* 151* 189* 181* 206*  BILITOT 0.6 0.8 0.7 0.6 2.5*  PROT 7.0 6.6 6.9 6.5 6.5  ALBUMIN 2.8* 2.8* 2.8* 2.7* 2.8*   Recent Labs  Lab 12/21/18 2236 12/24/18 1310  LIPASE 37 46   Lipid Profile: Recent Labs    12/25/18 0925  CHOL 172  HDL 33*  LDLCALC 85  TRIG 409*  CHOLHDL 5.2   Anemia Panel: Recent Labs    12/24/18 0449 12/25/18 0115  FERRITIN 2,171* 1,808*   Recent Results (from the past 240 hour(s))  SARS Coronavirus 2 [facility] order, Performed in [facility] hospital lab)     Status: Abnormal   Collection Time: 12/17/18  9:19 AM  Result Value Ref Range Status   SARS Coronavirus 2 POSITIVE (A) NEGATIVE Final    Comment: RESULT CALLED TO, READ BACK BY AND VERIFIED WITH: P.DOWD,RN [id number]  BY V.WILKINS (NOTE) If result is NEGATIVE SARS-CoV-2 target nucleic acids are NOT DETECTED. The SARS-CoV-2 RNA is generally detectable in upper and lower  respiratory specimens during the acute phase of infection. The lowest  concentration of SARS-CoV-2 viral copies this assay can detect is 250  copies /  mL. A negative result does not preclude SARS-CoV-2 infection  and should not be used as the sole basis for treatment or other  patient management decisions.  A negative result may occur with  improper specimen collection / handling, submission of specimen other  than nasopharyngeal swab, presence of viral mutation(s) within the  areas targeted by this assay, and inadequate number of viral copies  (<250 copies / mL). A negative result must be combined with clinical  observations, patient history, and epidemiological information. If result is POSITIVE SARS-CoV-2 target nucleic acids are DETECTED. T he SARS-CoV-2 RNA is generally detectable in upper and lower  respiratory specimens during the acute phase of infection.  Positive  results are indicative of active infection with SARS-CoV-2.  Clinical   correlation with patient history and other diagnostic information is  necessary to determine patient infection status.  Positive results do  not rule out bacterial infection or co-infection with other viruses. If result is PRESUMPTIVE POSTIVE SARS-CoV-2 nucleic acids MAY BE PRESENT.   A presumptive positive result was obtained on the submitted specimen  and confirmed on repeat testing.  While 2019 novel coronavirus  (SARS-CoV-2) nucleic acids may be present in the submitted sample  additional confirmatory testing may be necessary for epidemiological  and / or clinical management purposes  to differentiate between  SARS-CoV-2 and other Sarbecovirus currently known to infect humans.  If clinically indicated additional testing with an alternate test  methodology [phone]) is  advised. The SARS-CoV-2 RNA is generally  detectable in upper and lower respiratory specimens during the acute  phase of infection. The expected result is Negative. Fact Sheet for Patients:  BoilerBrush.com.cy Fact Sheet for Healthcare Providers: https://pope.com/ This test is not yet approved or cleared by the [place] FDA and has been authorized for detection and/or diagnosis of SARS-CoV-2 by FDA under an Emergency Use Authorization (EUA).  This EUA will remain in effect (meaning this test can be used) for the duration of the COVID-19 declaration under Section 564(b)(1) of the Act, 21 U.S.C. section 360bbb-3(b)(1), unless the authorization is terminated or revoked sooner. Performed at [facility], [address], [place], [place] [address]   Blood Culture (routine x 2)     Status: None   Collection Time: 12/17/18  9:19 AM  Result Value Ref Range Status   Specimen Description   Final    BLOOD LEFT ANTECUBITAL Performed at [facility], [address] [place], [place] [address]    Special Requests   Final    BOTTLES DRAWN AEROBIC  AND ANAEROBIC Blood Culture adequate volume Performed at [facility], [address], [place], [place] [address]    Culture   Final    NO GROWTH 5 DAYS Performed at [facility], [address] [place], [place] [address]    Report Status 12/22/2018 FINAL  Final  Blood Culture (routine x 2)     Status: None   Collection Time: 12/17/18 10:49 AM  Result Value Ref Range Status   Specimen Description   Final    BLOOD RIGHT ANTECUBITAL Performed at [facility], [address], [place], [place] [address]    Special Requests   Final    BOTTLES DRAWN AEROBIC AND ANAEROBIC Blood Culture results may not be optimal due to an excessive volume of blood received in culture bottles Performed at [facility], [address] [place], [place] [address]    Culture   Final  NO GROWTH 5 DAYS Performed at [facility], [address] [place], [place] [address]    Report Status 12/22/2018 FINAL  Final  Blood Culture (routine x 2)     Status: None (Preliminary result)   Collection Time: 12/21/18 10:36 PM  Result Value Ref Range Status   Specimen Description   Final    BLOOD RIGHT FOREARM Performed at [facility], [address], [place], [place] [address]    Special Requests   Final    BOTTLES DRAWN AEROBIC AND ANAEROBIC Blood Culture adequate volume Performed at [facility], [address] [place], [place] [address]    Culture   Final    NO GROWTH 4 DAYS Performed at [facility], [address] [place], [place] [address]    Report Status PENDING  Incomplete  Blood Culture (routine x 2)     Status: None (Preliminary result)   Collection Time: 12/21/18 10:36 PM  Result Value Ref Range Status   Specimen Description   Final    BLOOD LEFT FOREARM Performed at [facility], [address] [place], [place] [address]    Special Requests   Final    BOTTLES DRAWN AEROBIC AND ANAEROBIC  Blood Culture adequate volume Performed at [facility], [address], [place], [place] [address]    Culture   Final    NO GROWTH 4 DAYS Performed at [facility], [address] [place], [place] [address]    Report Status PENDING  Incomplete  Group A Strep by PCR     Status: Abnormal   Collection Time: 12/22/18 11:30 AM  Result Value Ref Range Status   Group A Strep by PCR NEGATIVE (A) NOT DETECTED Final    Comment: Performed at [facility], [address] [place], [place] [address]      Radiology Studies: Dg Chest Port 1 View  Result Date: 12/25/2018 CLINICAL DATA:  +COVID EXAM: PORTABLE CHEST 1 VIEW COMPARISON:  Radiograph FINDINGS: Normal cardiac silhouette. Patchy diffuse airspace disease in the lingula / LEFT lower lobe and lateral aspect of the RIGHT upper lobe and RIGHT lower lobe. No significant change in airspace pattern. Slight improvement aeration lung bases. Normal cardiac silhouette. IMPRESSION: 1. Some improvement in ventilation to the lung bases. 2. Diffuse airspace disease in the lingula / LEFT lower lobe and lateral aspect of the RIGHT lung similar to prior. Electronically Signed   By: Genevive Bi M.D.   On: 12/25/2018 04:49    Scheduled Meds: . aspirin EC  325 mg Oral Daily  . chlorhexidine  15 mL Mouth Rinse BID  . enoxaparin (LOVENOX) injection  40 mg Subcutaneous Q12H  . insulin aspart  0-15 Units Subcutaneous TID WC  . insulin aspart  0-5 Units Subcutaneous QHS  . insulin glargine  5 Units Subcutaneous Daily  . mouth rinse  15 mL Mouth Rinse q12n4p  . methylPREDNISolone (SOLU-MEDROL) injection  80 mg Intravenous Q12H  . oxymetazoline  1 spray Each Nare BID  . propranolol  40 mg Oral Daily  . sodium chloride flush  3 mL Intravenous Q12H  . sodium chloride flush  3 mL Intravenous Q12H  . vitamin C  500 mg Oral Daily  . zinc sulfate  220 mg Oral Daily   Continuous Infusions: . sodium chloride Stopped (12/23/18  1400)     LOS: 3 days   Time spent: 35 minutes.  Tyrone Nineyan B Madisson Kulaga, MD [facility] [email or web address] Password [email or web address] 12/25/2018, 11:06 AM

## 2018-12-25 NOTE — Plan of Care (Signed)
Patient stable, discussed POC with patient via interpreter, agreeable with plan. O2 via [place] D/C, pt tolerates well, ambulates w/o difficulty, denies question/concerns at this time.

## 2018-12-25 NOTE — Progress Notes (Signed)
RN notified by lab critical troponin @ 2.3, VSS, NAD noted. Dr. Jarvis Newcomer notified, advised to obtain EKG, no additional orders at this time.

## 2018-12-26 ENCOUNTER — Inpatient Hospital Stay (HOSPITAL_COMMUNITY): Payer: Medicaid Other

## 2018-12-26 ENCOUNTER — Other Ambulatory Visit (HOSPITAL_COMMUNITY): Payer: Self-pay

## 2018-12-26 DIAGNOSIS — U071 COVID-19: Secondary | ICD-10-CM | POA: Diagnosis not present

## 2018-12-26 DIAGNOSIS — R0602 Shortness of breath: Secondary | ICD-10-CM

## 2018-12-26 DIAGNOSIS — I10 Essential (primary) hypertension: Secondary | ICD-10-CM | POA: Diagnosis not present

## 2018-12-26 DIAGNOSIS — J069 Acute upper respiratory infection, unspecified: Secondary | ICD-10-CM | POA: Diagnosis not present

## 2018-12-26 LAB — CBC WITH DIFFERENTIAL/PLATELET
Abs Immature Granulocytes: 0.21 10*3/uL — ABNORMAL HIGH (ref 0.00–0.07)
Basophils Absolute: 0 10*3/uL (ref 0.0–0.1)
Basophils Relative: 0 %
Eosinophils Absolute: 0 10*3/uL (ref 0.0–0.5)
Eosinophils Relative: 0 %
HCT: 46.1 % (ref 39.0–52.0)
Hemoglobin: 15.2 g/dL (ref 13.0–17.0)
Immature Granulocytes: 2 %
Lymphocytes Relative: 8 %
Lymphs Abs: 1.1 10*3/uL (ref 0.7–4.0)
MCH: 29.9 pg (ref 26.0–34.0)
MCHC: 33 g/dL (ref 30.0–36.0)
MCV: 90.7 fL (ref 80.0–100.0)
Monocytes Absolute: 1 10*3/uL (ref 0.1–1.0)
Monocytes Relative: 7 %
Neutro Abs: 11.4 10*3/uL — ABNORMAL HIGH (ref 1.7–7.7)
Neutrophils Relative %: 83 %
Platelets: 543 10*3/uL — ABNORMAL HIGH (ref 150–400)
RBC: 5.08 MIL/uL (ref 4.22–5.81)
RDW: 12.6 % (ref 11.5–15.5)
WBC: 13.7 10*3/uL — ABNORMAL HIGH (ref 4.0–10.5)
nRBC: 0.1 % (ref 0.0–0.2)

## 2018-12-26 LAB — COMPREHENSIVE METABOLIC PANEL
ALT: 390 U/L — ABNORMAL HIGH (ref 0–44)
AST: 154 U/L — ABNORMAL HIGH (ref 15–41)
Albumin: 2.9 g/dL — ABNORMAL LOW (ref 3.5–5.0)
Alkaline Phosphatase: 216 U/L — ABNORMAL HIGH (ref 38–126)
Anion gap: 9 (ref 5–15)
BUN: 23 mg/dL — ABNORMAL HIGH (ref 6–20)
CO2: 29 mmol/L (ref 22–32)
Calcium: 8.7 mg/dL — ABNORMAL LOW (ref 8.9–10.3)
Chloride: 100 mmol/L (ref 98–111)
Creatinine, Ser: 0.73 mg/dL (ref 0.61–1.24)
GFR calc Af Amer: >60 mL/min (ref >60)
GFR calc non Af Amer: >60 mL/min (ref >60)
Glucose, Bld: 118 mg/dL — ABNORMAL HIGH (ref 70–99)
Potassium: 4.2 mmol/L (ref 3.5–5.1)
Sodium: 138 mmol/L (ref 135–145)
Total Bilirubin: 1.3 mg/dL — ABNORMAL HIGH (ref 0.3–1.2)
Total Protein: 6.5 g/dL (ref 6.5–8.1)

## 2018-12-26 LAB — D-DIMER, QUANTITATIVE: D-Dimer, Quant: 0.89 ug/mL-FEU — ABNORMAL HIGH (ref 0.00–0.50)

## 2018-12-26 LAB — CULTURE, BLOOD (ROUTINE X 2)
Culture: NO GROWTH
Culture: NO GROWTH
Special Requests: ADEQUATE
Special Requests: ADEQUATE

## 2018-12-26 LAB — FERRITIN: Ferritin: 2250 ng/mL — ABNORMAL HIGH (ref 24–336)

## 2018-12-26 LAB — ECHOCARDIOGRAM LIMITED
Height: 72 in
Weight: 2776.03 oz

## 2018-12-26 LAB — GLUCOSE, CAPILLARY
Glucose-Capillary: 101 mg/dL — ABNORMAL HIGH (ref 70–99)
Glucose-Capillary: 116 mg/dL — ABNORMAL HIGH (ref 70–99)

## 2018-12-26 LAB — MAGNESIUM: Magnesium: 2.4 mg/dL (ref 1.7–2.4)

## 2018-12-26 LAB — C-REACTIVE PROTEIN: CRP: 2.4 mg/dL — ABNORMAL HIGH (ref <1.0)

## 2018-12-26 LAB — TROPONIN I: Troponin I: 0.37 ng/mL (ref <0.03)

## 2018-12-26 MED ORDER — HEPARIN BOLUS VIA INFUSION
4000.0000 [IU] | Freq: Once | INTRAVENOUS | Status: DC
  Filled 2018-12-26: qty 4000

## 2018-12-26 MED ORDER — HEPARIN (PORCINE) 25000 UT/250ML-% IV SOLN: 1000.0000 [IU]/h | INTRAVENOUS | Status: DC

## 2018-12-26 MED ORDER — ENOXAPARIN SODIUM 80 MG/0.8ML ~~LOC~~ SOLN
1.0000 mg/kg | Freq: Two times a day (BID) | SUBCUTANEOUS | Status: DC
  Administered 2018-12-26 – 2018-12-28 (×5): 80 mg via SUBCUTANEOUS
  Filled 2018-12-26 (×5): qty 0.8

## 2018-12-26 MED ORDER — INSULIN ASPART 100 UNIT/ML ~~LOC~~ SOLN
3.0000 [IU] | Freq: Three times a day (TID) | SUBCUTANEOUS | Status: DC
  Administered 2018-12-26 – 2018-12-30 (×13): 3 [IU] via SUBCUTANEOUS

## 2018-12-26 MED ORDER — INSULIN GLARGINE 100 UNIT/ML ~~LOC~~ SOLN
10.0000 [IU] | Freq: Every day | SUBCUTANEOUS | Status: DC
  Administered 2018-12-27: 12:00:00 10 [IU] via SUBCUTANEOUS
  Filled 2018-12-26 (×2): qty 0.1

## 2018-12-26 NOTE — Progress Notes (Signed)
Inpatient Diabetes Program Recommendations  AACE/ADA: New Consensus Statement on Inpatient Glycemic Control (2015)  Target Ranges:  Prepandial:   less than 140 mg/dL      Peak postprandial:   less than 180 mg/dL (1-2 hours)      Critically ill patients:  140 - 180 mg/dL     Review of Glycemic Control  Diabetes history: None  Current orders for Inpatient glycemic control:  Lantus 10 units BID Novolog 0-15 units tid Novolog 0-5 units qhs Novolog 3 units tid meal coverage  Inpatient Diabetes Program Recommendations:    Steroids stopped yesterday. Fasting glucose 101 this am. Patient no previous history of DM. A1c obtained after steroids were started.  Consider d/cing Lantus and Novolog 3 units meal coverage.  Thanks,  Christena Deem RN, MSN, BC-ADM Inpatient Diabetes Coordinator Team Pager [phone] (8a-5p)

## 2018-12-26 NOTE — Progress Notes (Signed)
[facility] Department and AHF Team  ReDS Research Project   Patient #: [id number]  ReDS Measurement  Right: 38 %  Left: 40 %

## 2018-12-26 NOTE — Progress Notes (Signed)
ANTICOAGULATION CONSULT NOTE - Initial Consult  Pharmacy Consult for Lovenox Indication: chest pain/ACS  No Known Allergies  Patient Measurements: Height: 6' (182.9 cm) Weight: 173 lb 8 oz (78.7 kg) IBW/kg (Calculated) : 77.6  Vital Signs: Temp: 98.9 F (37.2 C) (05/11 0546) Temp Source: Oral (05/11 0546) BP: 134/84 (05/11 0546) Pulse Rate: 47 (05/11 0546)  Labs: Recent Labs    12/24/18 0449  12/25/18 0115 12/25/18 0925 12/25/18 1515 12/26/18 0430  HGB 14.7  --  15.0  --   --  15.2  HCT 44.3  --  46.5  --   --  46.1  PLT 487*  --  566*  --   --  543*  CREATININE 0.64  --   --  0.58*  --  0.73  TROPONINI  --    < >  --  0.23* 0.29* 0.37*   < > = values in this interval not displayed.    Estimated Creatinine Clearance: 115.9 mL/min (by C-G formula based on SCr of 0.73 mg/dL).   Medical History: Past Medical History:  Diagnosis Date  . Hypertension     Medications:  Medications Prior to Admission  Medication Sig Dispense Refill Last Dose  . ibuprofen (ADVIL) 200 MG tablet Take 400 mg by mouth every 6 (six) hours as needed for moderate pain.   Past Month at Unknown time  . levofloxacin (LEVAQUIN) 500 MG tablet Take 1 tablet (500 mg total) by mouth daily. 10 tablet 0 unk at Unknown time  . propranolol (INDERAL) 40 MG tablet Take 40 mg by mouth daily.   Past Week at Unknown time    Assessment: 22 YOM with COVID-19 and rising troponin to start on SQ Lovenox for ACS. Of note, pt is currently on prophylactic SQ Lovenox with last dose received last night. H/H wnl. Plt elevated. SCr wnl   Goal of Therapy:  Anti-Xa level 0.6-1 units/ml 4hrs after LMWH dose given Monitor platelets by anticoagulation protocol: Yes   Plan:  -Start Lovenox 1 mg/kg (80 mg) SQ twice daily  -Monitor renal fx, CBC and s/s of bleeding   Vinnie Level, PharmD., BCPS Clinical Pharmacist Clinical phone for 12/26/18 until 5pm: [phone] If after 5pm, please refer to [facility] [place] [facility] for  unit-specific pharmacist

## 2018-12-26 NOTE — Progress Notes (Signed)
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  Diagnosed with COVID-19 pneumonia and hypoxic respiratory failure and admitted to [facility]. Tocilizumab given 5/7.  Assessment & Plan: Active Problems:   Acute respiratory disease due to COVID-19 virus   Essential hypertension  Acute hypoxic respiratory failure due to covid-19 pneumonia: Given typical course of illness, should be convalescing soon, hypoxia seems to be resolved even with exertion, CXR this AM personally reviewed showing interval decreased in airspace opacities, though continues to have patchy infiltrates. - Continue lovenox as below - s/p tocilizumab 5/7. - Stopped steroids with improvement in respiratory symptoms and worsening hyperglycemia.  - Mobilize and prone as able, IS as much as possible to encourage deep breathing. Discussed w/pt daily. - Monitor blood cultures. s/p CTX, azithromycin 5/6.  Elevated LFTs: ALT predominance, now >5x ULN with concomitant rise of bilirubin. Concern for hepatotoxicity from actemra is possible. Rising modestly again today. - RUQ U/S this AM. - Hepatitis panel still pending.  Pharyngitis: Due to covid/cough. Rapid strep (GAS) and Strep Ag (Pneumococcus) negative. - Antitussives, chloraseptic spray.   HTN:  - Hold propranolol  Sinus bradycardia with sinus pauses: Mostly while sleeping, otherwise NSR and asymptomatic.  - Stopped beta blocker, will continue to monitor on telemetry  Thoracic aortic aneurysm: Incidentally noted at 4.1cm. - Recommend annual imaging followup by CTA or MRA.  History of pancreatitis: Suspected to be due to gallstones, no Hx alcohol use. Now with epigastric/lower chest pain that is atypical for  coronary syndrome. - Lipase normal x2.   Troponin elevation: Features of pain are atypical for cardiac etiology and troponin is not trending up significantly. This is well-documented in severe courses of covid, though he does have some rhythm disturbances (sinus bradycardia) and LAFB and nonspecific t-wave changes with unclear etiology.  - Continue telemetry - Has been on intermediate dose lovenox due to well-documented prothrombotic state of covid. In the setting of persistently rising troponin, and ongoing episodes of atypical pain will increase to ACS dosing x48-72 hours. - Started aspirin daily - prn NTG, morphine.  - With LFT's rising, not giving statin currently. LDL 85.  - Discussed with cardiology, Dr. Antoine Poche and Dr. Delton See. Echocardiogram today.  Steroid-induced hyperglycemia and T2DM: HbA1c 7.5% indicating longer term hyperglycemia. This was after only 3 days of steroids.  - Stopped steroids - Continue moderate SSI, HS coverage. With blood sugar of 356, mealtime insulin was added, should be held this AM while NPO. Continue   DVT prophylaxis: Lovenox as above Code Status: Full Family Communication: None at bedside Disposition Plan: Uncertain, pending clinical improvement.  Consultants:   None  Procedures:   Tocilizumab 12/22/2018   Antimicrobials:  Ceftriaxone, azithromycin 5/6   Subjective: Yesterday afternoon had 15 minutes of moderate pain higher up than previous episodes, definitively in mid chest radiating to the left chest. Again not associated with exertion/movement or eating. Possibly associated with breathing, though he is unsure, and abated spontaneously. No N/V/D, denies dyspnea at rest or during this time, some while walking but minimal and improved. No abd pain. Spanish video Rodena Piety [id number], used throughout encounter.  Objective: Vitals:   12/25/18 2334 12/26/18 0400 12/26/18 0546 12/26/18 0800  BP: 139/87 132/85 134/84 (!) 129/93  Pulse: Marland Kitchen)  52  (!) 55 (!) 47 (!) 56  Resp: 19 17 15 20   Temp: 98 F (36.7 C)  98.9 F (37.2 C) 98.7 F (37.1 C)  TempSrc: Oral  Oral   SpO2: 99% 100% 100% 97%  Weight:      Height:        Intake/Output Summary (Last 24 hours) at 12/26/2018 1138 Last data filed at 12/26/2018 1016 Gross per 24 hour  Intake 50 ml  Output 600 ml  Net -550 ml   Filed Weights   12/21/18 2044 12/22/18 0200  Weight: 87.1 kg 78.7 kg   Gen: 53 y.o. male in no distress Pulm: Nonlabored breathing room air. bilateral upper zone crackles, no wheezes, good air exchange. CV: Regular bradycardia. No murmur, rub, or gallop. No JVD, no dependent edema. GI: Abdomen soft, not tender, non-distended, with normoactive bowel sounds.  Ext: Warm, no deformities Skin: No rashes, lesions or ulcers on visualized skin. Neuro: Alert and oriented. No focal neurological deficits. Psych: Judgement and insight appear fair. Mood euthymic & affect congruent. Behavior is appropriate.    Data Reviewed: I have personally reviewed following labs and imaging studies         CBC: Recent Labs  Lab 12/22/18 0630 12/23/18 0433 12/24/18 0449 12/25/18 0115 12/26/18 0430  WBC 9.5 5.3 14.8* 12.5* 13.7*  NEUTROABS 7.7 4.5 13.8* 11.3* 11.4*  HGB 14.9 14.6 14.7 15.0 15.2  HCT 44.9 44.7 44.3 46.5 46.1  MCV 90.3 90.5 90.4 91.4 90.7  PLT 400 494* 487* 566* 543*   Basic Metabolic Panel: Recent Labs  Lab 12/22/18 0630 12/23/18 0433 12/24/18 0449 12/25/18 0925 12/26/18 0430  NA 137 138 139 138 138  K 3.8 4.0 4.2 4.8 4.2  CL 102 103 100 101 100  CO2 26 24 28 24 29   GLUCOSE 116* 232* 239* 279* 118*  BUN 13 23* 23* 23* 23*  CREATININE 0.64 0.67 0.64 0.58* 0.73  CALCIUM 8.1* 8.8* 8.7* 8.8* 8.7*  MG 2.1 2.3 2.2  --  2.4   GFR: Estimated Creatinine Clearance: 115.9 mL/min (by C-G formula based on SCr of 0.73 mg/dL). Liver Function Tests: Recent Labs  Lab 12/22/18 0630 12/23/18 0433 12/24/18 0449 12/25/18 0925 12/25/18 1515  12/26/18 0430  AST 44* 75* 91* 26  --  154*  ALT 43 83* 130* 252*  --  390*  ALKPHOS 151* 189* 181* 206*  --  216*  BILITOT 0.8 0.7 0.6 2.5* 2.1* 1.3*  PROT 6.6 6.9 6.5 6.5  --  6.5  ALBUMIN 2.8* 2.8* 2.7* 2.8*  --  2.9*   Recent Labs  Lab 12/21/18 2236 12/24/18 1310 12/25/18 1515  LIPASE 37 46 38   Lipid Profile: Recent Labs    12/25/18 0925  CHOL 172  HDL 33*  LDLCALC 85  TRIG [id number]*  CHOLHDL 5.2   Anemia Panel: Recent Labs    12/25/18 0115 12/26/18 0430  FERRITIN 1,808* 2,250*   Recent Results (from the past 240 hour(s))  SARS Coronavirus 2 [facility](Hospital order, Performed in [facility] lab)     Status: Abnormal   Collection Time: 12/17/18  9:19 AM  Result Value Ref Range Status   SARS Coronavirus 2 POSITIVE (A) NEGATIVE Final    Comment: RESULT CALLED TO, READ BACK BY AND VERIFIED WITH: P.DOWD,RN [id number] @1235  BY V.WILKINS (NOTE) If result is NEGATIVE SARS-CoV-2 target nucleic acids are NOT DETECTED. The SARS-CoV-2 RNA is generally detectable in upper and lower  respiratory specimens during the acute phase of  infection. The lowest  concentration of SARS-CoV-2 viral copies this assay can detect is 250  copies / mL. A negative result does not preclude SARS-CoV-2 infection  and should not be used as the sole basis for treatment or other  patient management decisions.  A negative result may occur with  improper specimen collection / handling, submission of specimen other  than nasopharyngeal swab, presence of viral mutation(s) within the  areas targeted by this assay, and inadequate number of viral copies  (<250 copies / mL). A negative result must be combined with clinical  observations, patient history, and epidemiological information. If result is POSITIVE SARS-CoV-2 target nucleic acids are DETECTED. T he SARS-CoV-2 RNA is generally detectable in upper and lower  respiratory specimens during the acute phase of infection.  Positive  results are  indicative of active infection with SARS-CoV-2.  Clinical  correlation with patient history and other diagnostic information is  necessary to determine patient infection status.  Positive results do  not rule out bacterial infection or co-infection with other viruses. If result is PRESUMPTIVE POSTIVE SARS-CoV-2 nucleic acids MAY BE PRESENT.   A presumptive positive result was obtained on the submitted specimen  and confirmed on repeat testing.  While 2019 novel coronavirus  (SARS-CoV-2) nucleic acids may be present in the submitted sample  additional confirmatory testing may be necessary for epidemiological  and / or clinical management purposes  to differentiate between  SARS-CoV-2 and other Sarbecovirus currently known to infect humans.  If clinically indicated additional testing with an alternate test  methodology [phone]) is  advised. The SARS-CoV-2 RNA is generally  detectable in upper and lower respiratory specimens during the acute  phase of infection. The expected result is Negative. Fact Sheet for Patients:  BoilerBrush.com.cy Fact Sheet for Healthcare Providers: https://pope.com/ This test is not yet approved or cleared by the [place] FDA and has been authorized for detection and/or diagnosis of SARS-CoV-2 by FDA under an Emergency Use Authorization (EUA).  This EUA will remain in effect (meaning this test can be used) for the duration of the COVID-19 declaration under Section 564(b)(1) of the Act, 21 U.S.C. section 360bbb-3(b)(1), unless the authorization is terminated or revoked sooner. Performed at [facility], [address] [place], [place] [address]   Blood Culture (routine x 2)     Status: None   Collection Time: 12/17/18  9:19 AM  Result Value Ref Range Status   Specimen Description   Final    BLOOD LEFT ANTECUBITAL Performed at [facility], [address], [place], [place]  [address]    Special Requests   Final    BOTTLES DRAWN AEROBIC AND ANAEROBIC Blood Culture adequate volume Performed at [facility], [address] [place], [place] [address]    Culture   Final    NO GROWTH 5 DAYS Performed at [facility], [address], [place], [place] [address]    Report Status 12/22/2018 FINAL  Final  Blood Culture (routine x 2)     Status: None   Collection Time: 12/17/18 10:49 AM  Result Value Ref Range Status   Specimen Description   Final    BLOOD RIGHT ANTECUBITAL Performed at [facility], [address] [place], [place] [address]    Special Requests   Final    BOTTLES DRAWN AEROBIC AND ANAEROBIC Blood Culture results may not be optimal due to an excessive volume of blood received in culture bottles Performed at  [facility], [address], [place], [place] [address]    Culture   Final    NO GROWTH 5 DAYS Performed at [facility], [address], [place], [place] [address]    Report Status 12/22/2018 FINAL  Final  Blood Culture (routine x 2)     Status: None   Collection Time: 12/21/18 10:36 PM  Result Value Ref Range Status   Specimen Description   Final    BLOOD RIGHT FOREARM Performed at [facility], [address], [place], [place] [address]    Special Requests   Final    BOTTLES DRAWN AEROBIC AND ANAEROBIC Blood Culture adequate volume Performed at [facility], [address] [place], [place] [address]    Culture   Final    NO GROWTH 5 DAYS Performed at [facility], [address], [place], [place] [address]    Report Status 12/26/2018 FINAL  Final  Blood Culture (routine x 2)     Status: None   Collection Time: 12/21/18 10:36 PM  Result Value Ref Range Status   Specimen Description   Final    BLOOD LEFT FOREARM Performed at [facility], [address] [place], [place] [address]    Special Requests   Final    BOTTLES  DRAWN AEROBIC AND ANAEROBIC Blood Culture adequate volume Performed at [facility], [address] [place], [place] [address]    Culture   Final    NO GROWTH 5 DAYS Performed at [facility], [address], [place], [place] [address]    Report Status 12/26/2018 FINAL  Final  Group A Strep by PCR     Status: Abnormal   Collection Time: 12/22/18 11:30 AM  Result Value Ref Range Status   Group A Strep by PCR NEGATIVE (A) NOT DETECTED Final    Comment: Performed at [facility], [address], [place], [place] [address]      Radiology Studies: Dg Chest Port 1 View  Result Date: 12/25/2018 CLINICAL DATA:  +COVID EXAM: PORTABLE CHEST 1 VIEW COMPARISON:  Radiograph FINDINGS: Normal cardiac silhouette. Patchy diffuse airspace disease in the lingula / LEFT lower lobe and lateral aspect of the RIGHT upper lobe and RIGHT lower lobe. No significant change in airspace pattern. Slight improvement aeration lung bases. Normal cardiac silhouette. IMPRESSION: 1. Some improvement in ventilation to the lung bases. 2. Diffuse airspace disease in the lingula / LEFT lower lobe and lateral aspect of the RIGHT lung similar to prior. Electronically Signed   By: Genevive Bi M.D.   On: 12/25/2018 04:49   US Abdomen Limited Ruq  Result Date: 12/26/2018 CLINICAL DATA:  Elevated liver enzymes.  COVID-19 positive EXAM: ULTRASOUND ABDOMEN LIMITED RIGHT UPPER QUADRANT COMPARISON:  None. FINDINGS: Gallbladder: No gallstones or wall thickening visualized within the gallbladder, there is an echogenic focus measuring 2.2 cm in length consistent with either one large or multiple adherent gallstones. There is extensive sludge in the gallbladder as well. There is thickening of the gallbladder wall along a portion of its anterior aspect. There is no pericholecystic fluid. No focal tenderness is noted over the gallbladder with transducer period. Common bile duct: Diameter: 5 mm. No  intrahepatic or extrahepatic biliary duct dilatation. Liver: No focal lesion identified. Liver echogenicity is within normal limits. Portal vein is patent on color Doppler imaging with normal direction of blood flow towards the liver. IMPRESSION:  Cholelithiasis and sludge within the gallbladder. There is wall thickening along a portion of the anterior wall of the gallbladder. Question a degree of acute cholecystitis given this combination of findings. In this regard, it may be reasonable to consider nuclear medicine hepatobiliary imaging study to assess for cystic duct patency. Study otherwise unremarkable. Electronically Signed   By: Bretta Bang III M.D.   On: 12/26/2018 11:34    Scheduled Meds:  aspirin EC  325 mg Oral Daily   chlorhexidine  15 mL Mouth Rinse BID   enoxaparin (LOVENOX) injection  1 mg/kg Subcutaneous Q12H   insulin aspart  0-15 Units Subcutaneous TID WC   insulin aspart  0-5 Units Subcutaneous QHS   insulin aspart  3 Units Subcutaneous TID WC   insulin glargine  10 Units Subcutaneous BID   mouth rinse  15 mL Mouth Rinse q12n4p   oxymetazoline  1 spray Each Nare BID   sodium chloride flush  3 mL Intravenous Q12H   sodium chloride flush  3 mL Intravenous Q12H   vitamin C  500 mg Oral Daily   zinc sulfate  220 mg Oral Daily   Continuous Infusions:  sodium chloride Stopped (12/23/18 1400)     LOS: 4 days   Time spent: 35 minutes.  Tyrone Nine, MD [facility] [email or web address] Password [facility] 12/26/2018, 11:38 AM

## 2018-12-26 NOTE — Progress Notes (Signed)
  Echocardiogram 2D Echocardiogram limited has been performed.  Leta Jungling M 12/26/2018, 12:18 PM

## 2018-12-27 DIAGNOSIS — J069 Acute upper respiratory infection, unspecified: Secondary | ICD-10-CM

## 2018-12-27 DIAGNOSIS — I10 Essential (primary) hypertension: Secondary | ICD-10-CM

## 2018-12-27 DIAGNOSIS — U071 COVID-19: Principal | ICD-10-CM

## 2018-12-27 LAB — COMPREHENSIVE METABOLIC PANEL
ALT: 251 U/L — ABNORMAL HIGH (ref 0–44)
AST: 45 U/L — ABNORMAL HIGH (ref 15–41)
Albumin: 3 g/dL — ABNORMAL LOW (ref 3.5–5.0)
Alkaline Phosphatase: 198 U/L — ABNORMAL HIGH (ref 38–126)
Anion gap: 9 (ref 5–15)
BUN: 29 mg/dL — ABNORMAL HIGH (ref 6–20)
CO2: 29 mmol/L (ref 22–32)
Calcium: 8.6 mg/dL — ABNORMAL LOW (ref 8.9–10.3)
Chloride: 100 mmol/L (ref 98–111)
Creatinine, Ser: 0.71 mg/dL (ref 0.61–1.24)
GFR calc Af Amer: >60 mL/min (ref >60)
GFR calc non Af Amer: >60 mL/min (ref >60)
Glucose, Bld: 116 mg/dL — ABNORMAL HIGH (ref 70–99)
Potassium: 4 mmol/L (ref 3.5–5.1)
Sodium: 138 mmol/L (ref 135–145)
Total Bilirubin: 1 mg/dL (ref 0.3–1.2)
Total Protein: 6.5 g/dL (ref 6.5–8.1)

## 2018-12-27 LAB — HEPATITIS PANEL, ACUTE
HCV Ab: <0.1 s/co ratio (ref 0.0–0.9)
Hep A IgM: NEGATIVE
Hep B C IgM: NEGATIVE
Hepatitis B Surface Ag: NEGATIVE

## 2018-12-27 LAB — CBC
HCT: 49.2 % (ref 39.0–52.0)
Hemoglobin: 16.3 g/dL (ref 13.0–17.0)
MCH: 30.2 pg (ref 26.0–34.0)
MCHC: 33.1 g/dL (ref 30.0–36.0)
MCV: 91.3 fL (ref 80.0–100.0)
Platelets: 548 10*3/uL — ABNORMAL HIGH (ref 150–400)
RBC: 5.39 MIL/uL (ref 4.22–5.81)
RDW: 12.9 % (ref 11.5–15.5)
WBC: 10.4 10*3/uL (ref 4.0–10.5)
nRBC: 0.3 % — ABNORMAL HIGH (ref 0.0–0.2)

## 2018-12-27 LAB — GLUCOSE, CAPILLARY
Glucose-Capillary: 179 mg/dL — ABNORMAL HIGH (ref 70–99)
Glucose-Capillary: 250 mg/dL — ABNORMAL HIGH (ref 70–99)
Glucose-Capillary: 235 mg/dL — ABNORMAL HIGH (ref 70–99)
Glucose-Capillary: 133 mg/dL — ABNORMAL HIGH (ref 70–99)
Glucose-Capillary: 161 mg/dL — ABNORMAL HIGH (ref 70–99)
Glucose-Capillary: 230 mg/dL — ABNORMAL HIGH (ref 70–99)
Glucose-Capillary: 224 mg/dL — ABNORMAL HIGH (ref 70–99)

## 2018-12-27 LAB — HEPATITIS B SURFACE ANTIBODY, QUANTITATIVE: Hep B S AB Quant (Post): <3.1 m[IU]/mL — ABNORMAL LOW (ref >9.9)

## 2018-12-27 LAB — TROPONIN I: Troponin I: 0.41 ng/mL (ref <0.03)

## 2018-12-27 NOTE — Progress Notes (Signed)
PROGRESS NOTE  Tidus Mordan  URK:[id number] DOB: 01/22/1966 DOA: 12/21/2018 PCP: None Brief Narrative: Creek Roop is a 53 y.o. male with a history of HTN who was exposed to his wife who has a positive COVID-19 infection about 2 weeks ago comes in with fever, sore throat, diarrhea, extreme shortness of breath.  Diagnosed with COVID-19 pneumonia and hypoxic respiratory failure and admitted to [facility]. Tocilizumab given 5/7.  Assessment & Plan: Active Problems:   Acute respiratory disease due to COVID-19 virus   Essential hypertension  Acute hypoxic respiratory failure due to covid-19 pneumonia: Given typical course of illness, should be convalescing soon, hypoxia seems to be resolved even with exertion, CXR this AM personally reviewed showing interval decreased in airspace opacities, though continues to have patchy infiltrates. - Continue lovenox as below - s/p tocilizumab 5/7. - Stopped steroids with improvement in respiratory symptoms and worsening hyperglycemia.  - Mobilize and continue to prone as able, IS as much as possible to encourage deep breathing. Discussed w/pt daily, now pulling 1000cc (improved from prior) - Blood cultures negative, s/p CTX, azithromycin 5/6.  Elevated LFTs, cholelithiasis: ALT predominance, improving 5/12. No RUQ pain. U/S demonstrating cholelithiasis, sludge, and anterior GB wall thickening though he has no tenderness and is tolerating po without pain. No ductal dilatation.  - Defer further work up to outpatient setting as long as no signs or symptoms attributable to cholecystitis, also aiming to avoid exposure of staff w/nuclear medicine scan.  Troponin elevation: Features of pain are atypical for cardiac etiology and troponin is not trending up significantly. This is well-documented in severe courses of covid, though he does have some rhythm disturbances (sinus bradycardia) and LAFB and nonspecific t-wave changes with unclear significance.  -  Echocardiogram does not mention wall motion abnormalities and LVEF is preserved.  - Continue telemetry - Was on intermediate dose lovenox due to well-documented prothrombotic state of covid, transitioned to therapeutic dosing 5/11. Will continue this for total x48-72 hours. - Started aspirin daily - prn NTG, morphine.  - With LFT's rising, not giving statin currently. LDL 85.  - Discussed with cardiology, Dr. Antoine Poche and Dr. Delton See.  Steroid-induced hyperglycemia and T2DM: HbA1c 7.5% indicating longer term hyperglycemia. This was after only 3 days of steroids.  - Stopped steroids - Continue moderate SSI, HS coverage. - Improving control.  Pharyngitis: Due to covid/cough. Rapid strep (GAS) and Strep Ag (Pneumococcus) negative. - Antitussives, chloraseptic spray.   HTN:  - Hold propranolol, BP not elevated currently  Sinus bradycardia with sinus pauses: Mostly while sleeping, otherwise NSR and asymptomatic.  - Stopped beta blocker, improving. - Continue telemetry  Thoracic aortic aneurysm: Incidentally noted at 4.1cm. - Recommend annual imaging followup by CTA or MRA.  History of pancreatitis: Suspected to be due to gallstones, no Hx alcohol use. Now with epigastric/lower chest pain that is atypical for coronary syndrome. - Lipase normal x2.   DVT prophylaxis: Lovenox as above Code Status: Full Family Communication: None at bedside Disposition Plan: Uncertain, pending clinical improvement.  Consultants:   None  Procedures:   Tocilizumab 12/22/2018   Antimicrobials:  Ceftriaxone, azithromycin 5/6   Subjective: No further chest or abdominal pains. Feeling short of breath with exertion, stable from yesterday. Has been put back on oxygen. Spanish video interpretor Rexford Maus [phone]  Objective: Vitals:   12/26/18 2000 12/27/18 0000 12/27/18 0437 12/27/18 0835  BP: 126/85 128/80 126/78 110/80  Pulse:    81  Resp:    (!) 22  Temp: 98.2 F (36.8 C)  98.6 F (37 C) 98.6 F  (37 C) 98.7 F (37.1 C)  TempSrc: Oral Oral Oral Oral  SpO2: 95%   90%  Weight:      Height:        Intake/Output Summary (Last 24 hours) at 12/27/2018 1315 Last data filed at 12/27/2018 0900 Gross per 24 hour  Intake 360 ml  Output 950 ml  Net -590 ml   Filed Weights   12/21/18 2044 12/22/18 0200  Weight: 87.1 kg 78.7 kg   Gen: 53 y.o. male in no distress Pulm: Nonlabored breathing supplemental oxygen. Clear with good air movement. CV: Regular borderline bradycardia. No murmur, rub, or gallop. No JVD, no dependent edema. GI: Abdomen soft, not at all tender, non-distended, with normoactive bowel sounds.  Ext: Warm, no deformities Skin: No rashes, lesions or ulcers on visualized skin. Neuro: Alert and oriented. No focal neurological deficits. Psych: Judgement and insight appear fair. Mood euthymic & affect congruent. Behavior is appropriate.    Data Reviewed: I have personally reviewed following labs and imaging studies         CBC: Recent Labs  Lab 12/22/18 0630 12/23/18 0433 12/24/18 0449 12/25/18 0115 12/26/18 0430 12/27/18 0441  WBC 9.5 5.3 14.8* 12.5* 13.7* 10.4  NEUTROABS 7.7 4.5 13.8* 11.3* 11.4*  --   HGB 14.9 14.6 14.7 15.0 15.2 16.3  HCT 44.9 44.7 44.3 46.5 46.1 49.2  MCV 90.3 90.5 90.4 91.4 90.7 91.3  PLT 400 494* 487* 566* 543* 548*   Basic Metabolic Panel: Recent Labs  Lab 12/22/18 0630 12/23/18 0433 12/24/18 0449 12/25/18 0925 12/26/18 0430 12/27/18 0441  NA 137 138 139 138 138 138  K 3.8 4.0 4.2 4.8 4.2 4.0  CL 102 103 100 101 100 100  CO2 GLUCOSE 116* 232* 239* 279* 118* 116*  BUN 13 23* 23* 23* 23* 29*  CREATININE 0.64 0.67 0.64 0.58* 0.73 0.71  CALCIUM 8.1* 8.8* 8.7* 8.8* 8.7* 8.6*  MG 2.1 2.3 2.2  --  2.4  --    GFR: Estimated Creatinine Clearance: 115.9 mL/min (by C-G formula based on SCr of 0.71 mg/dL). Liver Function Tests: Recent Labs  Lab 12/23/18 0433 12/24/18 0449 12/25/18 0925 12/25/18 1515  12/26/18 0430 12/27/18 0441  AST 75* 91* 26  --  154* 45*  ALT 83* 130* 252*  --  390* 251*  ALKPHOS 189* 181* 206*  --  216* 198*  BILITOT 0.7 0.6 2.5* 2.1* 1.3* 1.0  PROT 6.9 6.5 6.5  --  6.5 6.5  ALBUMIN 2.8* 2.7* 2.8*  --  2.9* 3.0*   Recent Labs  Lab 12/21/18 2236 12/24/18 1310 12/25/18 1515  LIPASE 37 46 38   Lipid Profile: Recent Labs    12/25/18 0925  CHOL 172  HDL 33*  LDLCALC 85  TRIG 578*  CHOLHDL 5.2   Anemia Panel: Recent Labs    12/25/18 0115 12/26/18 0430  FERRITIN 1,808* 2,250*   Recent Results (from the past 240 hour(s))  Blood Culture (routine x 2)     Status: None   Collection Time: 12/21/18 10:36 PM  Result Value Ref Range Status   Specimen Description   Final    BLOOD RIGHT FOREARM Performed at [facility], [address], [place], [place] [address]    Special Requests   Final    BOTTLES DRAWN AEROBIC AND ANAEROBIC Blood Culture adequate volume Performed at [facility], [address] [place], [place]  [id number]    Culture   Final    NO GROWTH 5 DAYS Performed at [facility], [address] [place], [place] [address]    Report Status 12/26/2018 FINAL  Final  Blood Culture (routine x 2)     Status: None   Collection Time: 12/21/18 10:36 PM  Result Value Ref Range Status   Specimen Description   Final    BLOOD LEFT FOREARM Performed at [facility], [address] [place], [place] [address]    Special Requests   Final    BOTTLES DRAWN AEROBIC AND ANAEROBIC Blood Culture adequate volume Performed at [facility], [address] [place], [place] [id number]    Culture   Final    NO GROWTH 5 DAYS Performed at [facility], [address] [place], [place] [address]    Report Status 12/26/2018 FINAL  Final  Group A Strep by PCR     Status: Abnormal   Collection Time: 12/22/18 11:30 AM  Result Value Ref Range Status   Group A Strep by PCR NEGATIVE (A) NOT DETECTED  Final    Comment: Performed at [facility], [address] [place], [place] [address]      Radiology Studies: Koreas Abdomen Limited Ruq  Result Date: 12/26/2018 CLINICAL DATA:  Elevated liver enzymes.  COVID-19 positive EXAM: ULTRASOUND ABDOMEN LIMITED RIGHT UPPER QUADRANT COMPARISON:  None. FINDINGS: Gallbladder: No gallstones or wall thickening visualized within the gallbladder, there is an echogenic focus measuring 2.2 cm in length consistent with either one large or multiple adherent gallstones. There is extensive sludge in the gallbladder as well. There is thickening of the gallbladder wall along a portion of its anterior aspect. There is no pericholecystic fluid. No focal tenderness is noted over the gallbladder with transducer period. Common bile duct: Diameter: 5 mm. No intrahepatic or extrahepatic biliary duct dilatation. Liver: No focal lesion identified. Liver echogenicity is within normal limits. Portal vein is patent on color Doppler imaging with normal direction of blood flow towards the liver. IMPRESSION: Cholelithiasis and sludge within the gallbladder. There is wall thickening along a portion of the anterior wall of the gallbladder. Question a degree of acute cholecystitis given this combination of findings. In this regard, it may be reasonable to consider nuclear medicine hepatobiliary imaging study to assess for cystic duct patency. Study otherwise unremarkable. Electronically Signed   By: Bretta BangWilliam  Woodruff III M.D.   On: 12/26/2018 11:34    Scheduled Meds: . aspirin EC  325 mg Oral Daily  . chlorhexidine  15 mL Mouth Rinse BID  . enoxaparin (LOVENOX) injection  1 mg/kg Subcutaneous Q12H  . insulin aspart  0-15 Units Subcutaneous TID WC  . insulin aspart  0-5 Units Subcutaneous QHS  . insulin aspart  3 Units Subcutaneous TID WC  . insulin glargine  10 Units Subcutaneous Daily  . mouth rinse  15 mL Mouth Rinse q12n4p  . sodium chloride flush  3 mL Intravenous Q12H  . sodium  chloride flush  3 mL Intravenous Q12H  . vitamin C  500 mg Oral Daily  . zinc sulfate  220 mg Oral Daily   Continuous Infusions: . sodium chloride Stopped (12/23/18 1400)     LOS: 5 days   Time spent: 35 minutes.  Tyrone Nineyan B , MD [facility] [email or web address] Password [email or web address] 12/27/2018, 1:15 PM

## 2018-12-27 NOTE — Progress Notes (Signed)
CardioVascular Research Department and AHF Team  ReDS Research Project   Patient #: [id number]  ReDS Measurement  Right: 47 %  Left: 46 %

## 2018-12-27 NOTE — TOC Initial Note (Signed)
Transition of Care [facility]) - Initial/Assessment Note    Patient Details  Name: Cameron Hampton MRN: [id number] Date of Birth: 1966/08/12  Transition of Care [facility]) CM/SW Contact:    Colleen Can RN, BSN, NCM-BC, ACM-RN [phone] Phone Number: 12/27/2018, 3:31 PM  Clinical Narrative:                 CM following for dispositional needs. Patient lived at home with his family and was independent with his ADLs PTA. Patient is uninsured with no active health insurance. CM arranged at hospital follow-up televisit appointment at [facility] on 01/02/19 @ 1430; AVS updated. Patient has remained acutely ill since COVID diagnosis, with the CM team to continue to follow for needs.   Expected Discharge Plan: Home/Self Care Barriers to Discharge: Continued Medical Work up   Expected Discharge Plan and Services Expected Discharge Plan: Home/Self Care   Discharge Planning Services: CM Consult  Prior Living Arrangements/Services  Patient language and need for interpreter reviewed:: Yes(Spanish Speaking Only)  Activities of Daily Living Home Assistive Devices/Equipment: None ADL Screening (condition at time of admission) Patient's cognitive ability adequate to safely complete daily activities?: Yes Is the patient deaf or have difficulty hearing?: No Does the patient have difficulty seeing, even when wearing glasses/contacts?: No Does the patient have difficulty concentrating, remembering, or making decisions?: No Patient able to express need for assistance with ADLs?: Yes Does the patient have difficulty dressing or bathing?: No Independently performs ADLs?: Yes (appropriate for developmental age) Does the patient have difficulty walking or climbing stairs?: No Weakness of Legs: Both Weakness of Arms/Hands: Both   Admission diagnosis:  COVID-19 virus infection [U07.1] Patient Active Problem List   Diagnosis Date Noted  . Acute respiratory disease due to COVID-19 virus 12/22/2018  . Essential  hypertension 12/22/2018   PCP:  Patient, No Pcp Per Pharmacy:   [id number] [place], [place] - [address] AT [facility] [address] [place] [address] Phone: [phone] Fax: [phone]

## 2018-12-28 DIAGNOSIS — R945 Abnormal results of liver function studies: Secondary | ICD-10-CM | POA: Diagnosis not present

## 2018-12-28 DIAGNOSIS — J069 Acute upper respiratory infection, unspecified: Secondary | ICD-10-CM | POA: Diagnosis not present

## 2018-12-28 DIAGNOSIS — R7989 Other specified abnormal findings of blood chemistry: Secondary | ICD-10-CM

## 2018-12-28 DIAGNOSIS — U071 COVID-19: Secondary | ICD-10-CM | POA: Diagnosis not present

## 2018-12-28 LAB — CBC
HCT: 48.2 % (ref 39.0–52.0)
Hemoglobin: 15.9 g/dL (ref 13.0–17.0)
MCH: 30.2 pg (ref 26.0–34.0)
MCHC: 33 g/dL (ref 30.0–36.0)
MCV: 91.6 fL (ref 80.0–100.0)
Platelets: 563 10*3/uL — ABNORMAL HIGH (ref 150–400)
RBC: 5.26 MIL/uL (ref 4.22–5.81)
RDW: 13 % (ref 11.5–15.5)
WBC: 10.1 10*3/uL (ref 4.0–10.5)
nRBC: 0 % (ref 0.0–0.2)

## 2018-12-28 LAB — GLUCOSE, CAPILLARY
Glucose-Capillary: 226 mg/dL — ABNORMAL HIGH (ref 70–99)
Glucose-Capillary: 132 mg/dL — ABNORMAL HIGH (ref 70–99)
Glucose-Capillary: 176 mg/dL — ABNORMAL HIGH (ref 70–99)
Glucose-Capillary: 137 mg/dL — ABNORMAL HIGH (ref 70–99)
Glucose-Capillary: 132 mg/dL — ABNORMAL HIGH (ref 70–99)

## 2018-12-28 MED ORDER — ENOXAPARIN SODIUM 40 MG/0.4ML ~~LOC~~ SOLN
40.0000 mg | Freq: Two times a day (BID) | SUBCUTANEOUS | Status: DC
  Administered 2018-12-28 – 2018-12-31 (×6): 40 mg via SUBCUTANEOUS
  Filled 2018-12-28 (×6): qty 0.4

## 2018-12-28 MED ORDER — FAMOTIDINE 20 MG PO TABS
20.0000 mg | ORAL_TABLET | Freq: Two times a day (BID) | ORAL | Status: DC
  Administered 2018-12-28 – 2018-12-31 (×7): 20 mg via ORAL
  Filled 2018-12-28 (×7): qty 1

## 2018-12-28 MED ORDER — INSULIN GLARGINE 100 UNIT/ML ~~LOC~~ SOLN
15.0000 [IU] | Freq: Every day | SUBCUTANEOUS | Status: DC
  Administered 2018-12-28 – 2018-12-30 (×3): 15 [IU] via SUBCUTANEOUS
  Filled 2018-12-28 (×3): qty 0.15

## 2018-12-28 MED ORDER — PANTOPRAZOLE SODIUM 40 MG PO TBEC
40.0000 mg | DELAYED_RELEASE_TABLET | Freq: Every day | ORAL | Status: DC
  Administered 2018-12-28 – 2018-12-31 (×4): 40 mg via ORAL
  Filled 2018-12-28 (×3): qty 1

## 2018-12-28 MED ORDER — POLYETHYLENE GLYCOL 3350 17 G PO PACK
34.0000 g | PACK | Freq: Two times a day (BID) | ORAL | Status: AC
  Administered 2018-12-28 (×2): 34 g via ORAL
  Filled 2018-12-28 (×2): qty 2

## 2018-12-28 MED ORDER — ASPIRIN EC 81 MG PO TBEC
81.0000 mg | DELAYED_RELEASE_TABLET | Freq: Every day | ORAL | Status: DC
  Administered 2018-12-29 – 2018-12-31 (×3): 81 mg via ORAL
  Filled 2018-12-28 (×3): qty 1

## 2018-12-28 NOTE — Progress Notes (Signed)
CardioVascular Research Department and AHF Team  ReDS Research Project   Patient #: [id number]  ReDS Measurement  Right: 37 %  Left: 39 %

## 2018-12-28 NOTE — Progress Notes (Signed)
PROGRESS NOTE  Cameron Hampton  KGM:[id number] DOB: 07/01/1966 DOA: 12/21/2018 PCP: None Brief Narrative: Cameron Hampton is a 53 y.o. male with a history of HTN who was exposed to his wife who has a positive COVID-19 infection about 2 weeks ago comes in with fever, sore throat, diarrhea, extreme shortness of breath.  Diagnosed with COVID-19 pneumonia and hypoxic respiratory failure and admitted to [facility]. Tocilizumab given 5/7.  Subjective: Has any chest pain, abdominal pain, still reports some dyspnea on exertion, .  Assessment & Plan: Active Problems:   Acute respiratory disease due to COVID-19 virus   Essential hypertension  Acute hypoxic respiratory failure due to covid-19 pneumonia:  -Remains on 2 L nasal cannula this morning, will continue to wean as tolerated - Continue lovenox as below - s/p tocilizumab 5/7. - Stopped steroids with improvement in respiratory symptoms and worsening hyperglycemia.  -Courage to use incentive spirometry today  -Wean oxygen as tolerated - Blood cultures negative, s/p CTX, azithromycin 5/6.  Elevated LFTs, cholelithiasis: ALT predominance, improving 5/12. No RUQ pain. U/S demonstrating cholelithiasis, sludge, and anterior GB wall thickening though he has no tenderness and is tolerating po without pain. No ductal dilatation.  At this point I see no indication for further work-up especially he is asymptomatic, and his LFTs trending down, as this more likely will be related to deceiving Actemra.  Troponin elevation: - Features of pain are atypical for cardiac etiology and troponin is not trending up significantly. This is well-documented in severe courses of covid, though he does have some rhythm disturbances (sinus bradycardia) and LAFB and nonspecific t-wave changes with unclear significance.  - Echocardiogram does not mention wall motion abnormalities and LVEF is preserved.  - Continue telemetry - Was on intermediate dose lovenox due to  well-documented prothrombotic state of covid, transitioned to therapeutic dosing 5/11.  Will transition back to VTE prophylaxis Lovenox dose per COVID-19 protocol 40 mg every 12 hours from this evening. - Started aspirin daily - prn NTG, morphine.  - With LFT's rising, not giving statin currently. LDL 85.  - Previous MD discussed with cardiology, Dr. Antoine Poche and Dr. Delton See.  Steroid-induced hyperglycemia and T2DM: HbA1c 7.5% indicating longer term hyperglycemia. This was after only 3 days of steroids.  - Stopped steroids - Continue moderate SSI, HS coverage. - Improving control.  Pharyngitis: Due to covid/cough. Rapid strep (GAS) and Strep Ag (Pneumococcus) negative. - Antitussives, chloraseptic spray.   HTN:  - Hold propranolol, BP not elevated currently  Sinus bradycardia with sinus pauses: Mostly while sleeping, otherwise NSR and asymptomatic.  - Stopped beta blocker, improving. - Continue telemetry  Thoracic aortic aneurysm: Incidentally noted at 4.1cm. - Recommend annual imaging followup by CTA or MRA.  History of pancreatitis: Suspected to be due to gallstones, no Hx alcohol use. Now with epigastric/lower chest pain that is atypical for coronary syndrome. - Lipase normal x2.   DVT prophylaxis: Lovenox as above Code Status: Full Family Communication: None at bedside Disposition Plan: Uncertain, pending clinical improvement.  Consultants:   None  Procedures:   Tocilizumab 12/22/2018   Antimicrobials:  Ceftriaxone, azithromycin 5/6     Objective: Vitals:   12/28/18 0102 12/28/18 0400 12/28/18 0800 12/28/18 1209  BP:  102/77 (!) 102/7 109/76  Pulse:  64    Resp:  18 19   Temp: 98.3 F (36.8 C) 98.5 F (36.9 C) 98.1 F (36.7 C) 98.5 F (36.9 C)  TempSrc: Oral Oral Oral Oral  SpO2:  96% 93%   Weight:  Height:        Intake/Output Summary (Last 24 hours) at 12/28/2018 1306 Last data filed at 12/28/2018 1212 Gross per 24 hour  Intake -  Output 1335 ml   Net -1335 ml   Filed Weights   12/21/18 2044 12/22/18 0200  Weight: 87.1 kg 78.7 kg    Awake Alert, Oriented X 3, No new F.N deficits, Normal affect Symmetrical Chest wall movement, Good air movement bilaterally, CTAB RRR,No Gallops,Rubs or new Murmurs, No Parasternal Heave +ve B.Sounds, Abd Soft, No tenderness, No rebound - guarding or rigidity. No Cyanosis, Clubbing or edema, No new Rash or bruise     Data Reviewed: I have personally reviewed following labs and imaging studies       CBC: Recent Labs  Lab 12/22/18 0630 12/23/18 0433 12/24/18 0449 12/25/18 0115 12/26/18 0430 12/27/18 0441 12/28/18 0500  WBC 9.5 5.3 14.8* 12.5* 13.7* 10.4 10.1  NEUTROABS 7.7 4.5 13.8* 11.3* 11.4*  --   --   HGB 14.9 14.6 14.7 15.0 15.2 16.3 15.9  HCT 44.9 44.7 44.3 46.5 46.1 49.2 48.2  MCV 90.3 90.5 90.4 91.4 90.7 91.3 91.6  PLT 400 494* 487* 566* 543* 548* 563*   Basic Metabolic Panel: Recent Labs  Lab 12/22/18 0630 12/23/18 0433 12/24/18 0449 12/25/18 0925 12/26/18 0430 12/27/18 0441  NA 137 138 139 138 138 138  K 3.8 4.0 4.2 4.8 4.2 4.0  CL 102 103 100 101 100 100  CO2 26 24 28 24 29 29   GLUCOSE 116* 232* 239* 279* 118* 116*  BUN 13 23* 23* 23* 23* 29*  CREATININE 0.64 0.67 0.64 0.58* 0.73 0.71  CALCIUM 8.1* 8.8* 8.7* 8.8* 8.7* 8.6*  MG 2.1 2.3 2.2  --  2.4  --    GFR: Estimated Creatinine Clearance: 115.9 mL/min (by C-G formula based on SCr of 0.71 mg/dL). Liver Function Tests: Recent Labs  Lab 12/23/18 0433 12/24/18 0449 12/25/18 0925 12/25/18 1515 12/26/18 0430 12/27/18 0441  AST 75* 91* 26  --  154* 45*  ALT 83* 130* 252*  --  390* 251*  ALKPHOS 189* 181* 206*  --  216* 198*  BILITOT 0.7 0.6 2.5* 2.1* 1.3* 1.0  PROT 6.9 6.5 6.5  --  6.5 6.5  ALBUMIN 2.8* 2.7* 2.8*  --  2.9* 3.0*   Recent Labs  Lab 12/21/18 2236 12/24/18 1310 12/25/18 1515  LIPASE 37 46 38   Lipid Profile: No results for input(s): CHOL, HDL, LDLCALC, TRIG, CHOLHDL, LDLDIRECT in  the last 72 hours. Anemia Panel: Recent Labs    12/26/18 0430  FERRITIN 2,250*   Recent Results (from the past 240 hour(s))  Blood Culture (routine x 2)     Status: None   Collection Time: 12/21/18 10:36 PM  Result Value Ref Range Status   Specimen Description   Final    BLOOD RIGHT FOREARM Performed at [facility], [address] [place], [place] [address]    Special Requests   Final    BOTTLES DRAWN AEROBIC AND ANAEROBIC Blood Culture adequate volume Performed at [facility], [address] [place], [place] [address]    Culture   Final    NO GROWTH 5 DAYS Performed at [facility], [address] [place], [place] [address]    Report Status 12/26/2018 FINAL  Final  Blood Culture (routine x 2)     Status: None   Collection Time: 12/21/18 10:36 PM  Result Value Ref Range Status  Specimen Description   Final    BLOOD LEFT FOREARM Performed at [facility], [address] [place], [place] [address]    Special Requests   Final    BOTTLES DRAWN AEROBIC AND ANAEROBIC Blood Culture adequate volume Performed at [facility], [address] [place], [place] [address]    Culture   Final    NO GROWTH 5 DAYS Performed at [facility], [address] [place], [place] [address]    Report Status 12/26/2018 FINAL  Final  Group A Strep by PCR     Status: Abnormal   Collection Time: 12/22/18 11:30 AM  Result Value Ref Range Status   Group A Strep by PCR NEGATIVE (A) NOT DETECTED Final    Comment: Performed at [facility], [address], [place], [place] [address]      Radiology Studies: No results found.  Scheduled Meds: . aspirin EC  325 mg Oral Daily  . chlorhexidine  15 mL Mouth Rinse BID  . enoxaparin (LOVENOX) injection  1 mg/kg Subcutaneous Q12H  . famotidine  20 mg Oral BID  . insulin aspart  0-15 Units Subcutaneous TID WC  . insulin aspart  0-5 Units Subcutaneous QHS  . insulin aspart  3  Units Subcutaneous TID WC  . insulin glargine  15 Units Subcutaneous Daily  . mouth rinse  15 mL Mouth Rinse q12n4p  . pantoprazole  40 mg Oral Daily  . polyethylene glycol  34 g Oral BID  . sodium chloride flush  3 mL Intravenous Q12H  . sodium chloride flush  3 mL Intravenous Q12H  . vitamin C  500 mg Oral Daily  . zinc sulfate  220 mg Oral Daily   Continuous Infusions: . sodium chloride Stopped (12/23/18 1400)     LOS: 6 days   Time spent: 35 minutes.  Huey Bienenstock, MD [facility] [email or web address] Password [email or web address] 12/28/2018, 1:06 PM

## 2018-12-28 NOTE — Progress Notes (Signed)
Telemetry advised this Clinical research associate patient had a 9 beat run of v-tach, patient observed lying in bed with eyes open, denied any symptoms at this time. On call MD Virginia Rochester notified. No new orders. Will continue to monitor.

## 2018-12-29 DIAGNOSIS — R945 Abnormal results of liver function studies: Secondary | ICD-10-CM | POA: Diagnosis not present

## 2018-12-29 DIAGNOSIS — J069 Acute upper respiratory infection, unspecified: Secondary | ICD-10-CM | POA: Diagnosis not present

## 2018-12-29 DIAGNOSIS — U071 COVID-19: Secondary | ICD-10-CM | POA: Diagnosis not present

## 2018-12-29 LAB — COMPREHENSIVE METABOLIC PANEL
ALT: 113 U/L — ABNORMAL HIGH (ref 0–44)
AST: 27 U/L (ref 15–41)
Albumin: 2.8 g/dL — ABNORMAL LOW (ref 3.5–5.0)
Alkaline Phosphatase: 140 U/L — ABNORMAL HIGH (ref 38–126)
Anion gap: 9 (ref 5–15)
BUN: 23 mg/dL — ABNORMAL HIGH (ref 6–20)
CO2: 28 mmol/L (ref 22–32)
Calcium: 8.5 mg/dL — ABNORMAL LOW (ref 8.9–10.3)
Chloride: 100 mmol/L (ref 98–111)
Creatinine, Ser: 0.83 mg/dL (ref 0.61–1.24)
GFR calc Af Amer: >60 mL/min (ref >60)
GFR calc non Af Amer: >60 mL/min (ref >60)
Glucose, Bld: 109 mg/dL — ABNORMAL HIGH (ref 70–99)
Potassium: 4.5 mmol/L (ref 3.5–5.1)
Sodium: 137 mmol/L (ref 135–145)
Total Bilirubin: 1 mg/dL (ref 0.3–1.2)
Total Protein: 5.9 g/dL — ABNORMAL LOW (ref 6.5–8.1)

## 2018-12-29 LAB — CBC
HCT: 47.3 % (ref 39.0–52.0)
Hemoglobin: 15.6 g/dL (ref 13.0–17.0)
MCH: 30.2 pg (ref 26.0–34.0)
MCHC: 33 g/dL (ref 30.0–36.0)
MCV: 91.5 fL (ref 80.0–100.0)
Platelets: 549 10*3/uL — ABNORMAL HIGH (ref 150–400)
RBC: 5.17 MIL/uL (ref 4.22–5.81)
RDW: 13.2 % (ref 11.5–15.5)
WBC: 8.8 10*3/uL (ref 4.0–10.5)
nRBC: 0 % (ref 0.0–0.2)

## 2018-12-29 LAB — GLUCOSE, CAPILLARY
Glucose-Capillary: 96 mg/dL (ref 70–99)
Glucose-Capillary: 154 mg/dL — ABNORMAL HIGH (ref 70–99)
Glucose-Capillary: 126 mg/dL — ABNORMAL HIGH (ref 70–99)
Glucose-Capillary: 145 mg/dL — ABNORMAL HIGH (ref 70–99)

## 2018-12-29 LAB — D-DIMER, QUANTITATIVE: D-Dimer, Quant: 1.04 ug/mL-FEU — ABNORMAL HIGH (ref 0.00–0.50)

## 2018-12-29 LAB — TROPONIN I: Troponin I: 0.35 ng/mL (ref <0.03)

## 2018-12-29 MED ORDER — BACITRACIN ZINC 500 UNIT/GM EX OINT
TOPICAL_OINTMENT | Freq: Two times a day (BID) | CUTANEOUS | Status: DC
  Administered 2018-12-29 – 2018-12-30 (×4): via TOPICAL
  Filled 2018-12-29: qty 28.35

## 2018-12-29 MED ORDER — FUROSEMIDE 10 MG/ML IJ SOLN
40.0000 mg | Freq: Once | INTRAMUSCULAR | Status: AC
  Administered 2018-12-29: 16:00:00 40 mg via INTRAVENOUS
  Filled 2018-12-29: qty 4

## 2018-12-29 MED ORDER — METOPROLOL TARTRATE 25 MG PO TABS
12.5000 mg | ORAL_TABLET | Freq: Two times a day (BID) | ORAL | Status: DC
  Administered 2018-12-29 – 2018-12-31 (×5): 12.5 mg via ORAL
  Filled 2018-12-29 (×5): qty 1

## 2018-12-29 NOTE — Progress Notes (Signed)
CardioVascular Research Department and AHF Team  ReDS Research Project   Patient #: [id number]  ReDS Measurement  Right: 44 %  Left: 45 %

## 2018-12-29 NOTE — Progress Notes (Signed)
Patient complains of painful sensation at the tip of penis when urinating and moving around.  Minimal redness noted upon assessment.  Patient requests maybe shower will help.  Peri care wipes given also until decision for shower is made

## 2018-12-29 NOTE — Progress Notes (Addendum)
PROGRESS NOTE  Cameron Hampton  RJJ:[id number] DOB: 04/18/1966 DOA: 12/21/2018 PCP: None Brief Narrative: Cameron Hampton is a 53 y.o. male with a history of HTN who was exposed to his wife who has a positive COVID-19 infection about 2 weeks ago comes in with fever, sore throat, diarrhea, extreme shortness of breath.  Diagnosed with COVID-19 pneumonia and hypoxic respiratory failure and admitted to [facility]. Tocilizumab given 5/7.  Subjective: Denies any chest pain, reports intermittent dyspnea, but overall has improved  has any chest pain, abdominal pain, still reports some dyspnea on exertion,   Asessment & Plan: Active Problems:   Acute respiratory disease due to COVID-19 virus   Essential hypertension  Acute hypoxic respiratory failure due to covid-19 pneumonia:  -To wean today, but he dropped in the mid 80s, back on 2 L nasal cannula, will continue to wean, encouraged to use incentive spirometry again today, encouraged to prone as well . - Continue lovenox as below - s/p tocilizumab 5/7. - Stopped steroids with improvement in respiratory symptoms and worsening hyperglycemia.  - will give Lasix 40 mg IV once today -Wean oxygen as tolerated - Blood cultures negative, s/p CTX, azithromycin 5/6.  Elevated LFTs, cholelithiasis: ALT predominance, improving 5/12. No RUQ pain. U/S demonstrating cholelithiasis, sludge, and anterior GB wall thickening though he has no tenderness and is tolerating po without pain. No ductal dilatation.  At this point I see no indication for further work-up especially he is asymptomatic, and his LFTs trending down, as this more likely will be related to deceiving Actemra.  Troponin elevation: - Features of pain are atypical for cardiac etiology and troponin is not trending up significantly. This is well-documented in severe courses of covid, though he does have some rhythm disturbances (sinus bradycardia) and LAFB and nonspecific t-wave changes with unclear  significance.  - Echocardiogram does not mention wall motion abnormalities and LVEF is preserved.  - Continue telemetry - Was on intermediate dose lovenox due to well-documented prothrombotic state of covid, transitioned to therapeutic dosing 5/11.  Will transition back to VTE prophylaxis Lovenox dose per COVID-19 protocol 40 mg every 12 hours from this evening. - Started aspirin daily - prn NTG, morphine.  - With LFT's rising, not giving statin currently. LDL 85.  - Previous MD discussed with cardiology, Dr. Antoine Poche and Dr. Delton See. -Patient had 9 beats of V. tach today, will start on low-dose beta-blockers 12.5 mg twice daily, echo already showing preserved EF of 65%  Steroid-induced hyperglycemia and T2DM: HbA1c 7.5% indicating longer term hyperglycemia. This was after only 3 days of steroids.  - Stopped steroids - Continue moderate SSI, HS coverage. - Improving control.  Pharyngitis: Due to covid/cough. Rapid strep (GAS) and Strep Ag (Pneumococcus) negative. - Antitussives, chloraseptic spray.   HTN:  - Hold propranolol, BP not elevated currently  Sinus bradycardia with sinus pauses: Mostly while sleeping, otherwise NSR and asymptomatic.  - Stopped beta blocker, improving. - Continue telemetry  Thoracic aortic aneurysm: Incidentally noted at 4.1cm. - Recommend annual imaging followup by CTA or MRA.  History of pancreatitis: Suspected to be due to gallstones, no Hx alcohol use. Now with epigastric/lower chest pain that is atypical for coronary syndrome. - Lipase normal x2.   DVT prophylaxis: Lovenox as above Code Status: Full Family Communication: D/W patient Disposition Plan: Uncertain, pending clinical improvement.  Consultants:   None  Procedures:   Tocilizumab 12/22/2018   Antimicrobials:  Ceftriaxone, azithromycin 5/6     Objective: Vitals:   12/29/18 0400 12/29/18 0728 12/29/18  0815 12/29/18 1241  BP: (!) 114/94  108/79 108/79  Pulse: 66  64 71  Resp: 18   16 14   Temp: 98.4 F (36.9 C)  97.7 F (36.5 C) (!) 96.7 F (35.9 C)  TempSrc: Oral  Oral Axillary  SpO2: 95% (!) 86% 94% 96%  Weight:      Height:        Intake/Output Summary (Last 24 hours) at 12/29/2018 1353 Last data filed at 12/28/2018 2146 Gross per 24 hour  Intake 3 ml  Output 150 ml  Net -147 ml   Filed Weights   12/21/18 2044 12/22/18 0200  Weight: 87.1 kg 78.7 kg    Awake Alert, Oriented X 3, No new F.N deficits, Normal affect Symmetrical Chest wall movement, Good air movement bilaterally, CTAB RRR,No Gallops,Rubs or new Murmurs, No Parasternal Heave +ve B.Sounds, Abd Soft, No tenderness, No rebound - guarding or rigidity. No Cyanosis, Clubbing or edema, No new Rash or bruise      Data Reviewed: I have personally reviewed following labs and imaging studies       CBC: Recent Labs  Lab 12/23/18 0433 12/24/18 0449 12/25/18 0115 12/26/18 0430 12/27/18 0441 12/28/18 0500 12/29/18 0648  WBC 5.3 14.8* 12.5* 13.7* 10.4 10.1 8.8  NEUTROABS 4.5 13.8* 11.3* 11.4*  --   --   --   HGB 14.6 14.7 15.0 15.2 16.3 15.9 15.6  HCT 44.7 44.3 46.5 46.1 49.2 48.2 47.3  MCV 90.5 90.4 91.4 90.7 91.3 91.6 91.5  PLT 494* 487* 566* 543* 548* 563* 549*   Basic Metabolic Panel: Recent Labs  Lab 12/23/18 0433 12/24/18 0449 12/25/18 0925 12/26/18 0430 12/27/18 0441 12/29/18 0648  NA 138 139 138 138 138 137  K 4.0 4.2 4.8 4.2 4.0 4.5  CL 103 100 101 100 100 100  CO2 24 28 24 29 29 28   GLUCOSE 232* 239* 279* 118* 116* 109*  BUN 23* 23* 23* 23* 29* 23*  CREATININE 0.67 0.64 0.58* 0.73 0.71 0.83  CALCIUM 8.8* 8.7* 8.8* 8.7* 8.6* 8.5*  MG 2.3 2.2  --  2.4  --   --    GFR: Estimated Creatinine Clearance: 111.7 mL/min (by C-G formula based on SCr of 0.83 mg/dL). Liver Function Tests: Recent Labs  Lab 12/24/18 0449 12/25/18 0925 12/25/18 1515 12/26/18 0430 12/27/18 0441 12/29/18 0648  AST 91* 26  --  154* 45* 27  ALT 130* 252*  --  390* 251* 113*  ALKPHOS 181*  206*  --  216* 198* 140*  BILITOT 0.6 2.5* 2.1* 1.3* 1.0 1.0  PROT 6.5 6.5  --  6.5 6.5 5.9*  ALBUMIN 2.7* 2.8*  --  2.9* 3.0* 2.8*   Recent Labs  Lab 12/24/18 1310 12/25/18 1515  LIPASE 46 38   Lipid Profile: No results for input(s): CHOL, HDL, LDLCALC, TRIG, CHOLHDL, LDLDIRECT in the last 72 hours. Anemia Panel: No results for input(s): VITAMINB12, FOLATE, FERRITIN, TIBC, IRON, RETICCTPCT in the last 72 hours. Recent Results (from the past 240 hour(s))  Blood Culture (routine x 2)     Status: None   Collection Time: 12/21/18 10:36 PM  Result Value Ref Range Status   Specimen Description   Final    BLOOD RIGHT FOREARM Performed at [facility], [address] [place], [place] [id number]    Special Requests   Final    BOTTLES DRAWN AEROBIC AND ANAEROBIC Blood Culture adequate volume Performed at [facility], [address] [place],  [place] [address]    Culture   Final    NO GROWTH 5 DAYS Performed at [facility], [address] [place], [place] [address]    Report Status 12/26/2018 FINAL  Final  Blood Culture (routine x 2)     Status: None   Collection Time: 12/21/18 10:36 PM  Result Value Ref Range Status   Specimen Description   Final    BLOOD LEFT FOREARM Performed at [facility], [address], [place], [place] [address]    Special Requests   Final    BOTTLES DRAWN AEROBIC AND ANAEROBIC Blood Culture adequate volume Performed at [facility], [address], [place], [place] [address]    Culture   Final    NO GROWTH 5 DAYS Performed at [facility], [address], [place], [place] [address]    Report Status 12/26/2018 FINAL  Final  Group A Strep by PCR     Status: Abnormal   Collection Time: 12/22/18 11:30 AM  Result Value Ref Range Status   Group A Strep by PCR NEGATIVE (A) NOT DETECTED Final    Comment: Performed at [facility], [address], [place], [place] [address]       Radiology Studies: No results found.  Scheduled Meds: . aspirin EC  81 mg Oral Daily  . bacitracin   Topical BID  . chlorhexidine  15 mL Mouth Rinse BID  . enoxaparin (LOVENOX) injection  40 mg Subcutaneous Q12H  . famotidine  20 mg Oral BID  . furosemide  40 mg Intravenous Once  . insulin aspart  0-15 Units Subcutaneous TID WC  . insulin aspart  0-5 Units Subcutaneous QHS  . insulin aspart  3 Units Subcutaneous TID WC  . insulin glargine  15 Units Subcutaneous Daily  . mouth rinse  15 mL Mouth Rinse q12n4p  . metoprolol tartrate  12.5 mg Oral BID  . pantoprazole  40 mg Oral Daily  . sodium chloride flush  3 mL Intravenous Q12H  . sodium chloride flush  3 mL Intravenous Q12H  . vitamin C  500 mg Oral Daily  . zinc sulfate  220 mg Oral Daily   Continuous Infusions: . sodium chloride Stopped (12/23/18 1400)     LOS: 7 days   Time spent: 25 minutes.  Huey Bienenstock, MD [facility] [email or web address] Password [email or web address] 12/29/2018, 1:53 PM

## 2018-12-29 NOTE — Progress Notes (Signed)
Attempted to wean patient to RA.  O2 sat 86% RA.  Placed back on 2L [place].  O2 90%.  Will continue to monitor

## 2018-12-30 DIAGNOSIS — U071 COVID-19: Secondary | ICD-10-CM | POA: Diagnosis not present

## 2018-12-30 DIAGNOSIS — J069 Acute upper respiratory infection, unspecified: Secondary | ICD-10-CM | POA: Diagnosis not present

## 2018-12-30 DIAGNOSIS — R945 Abnormal results of liver function studies: Secondary | ICD-10-CM | POA: Diagnosis not present

## 2018-12-30 LAB — GLUCOSE, CAPILLARY
Glucose-Capillary: 164 mg/dL — ABNORMAL HIGH (ref 70–99)
Glucose-Capillary: 106 mg/dL — ABNORMAL HIGH (ref 70–99)
Glucose-Capillary: 127 mg/dL — ABNORMAL HIGH (ref 70–99)
Glucose-Capillary: 92 mg/dL (ref 70–99)

## 2018-12-30 MED ORDER — METFORMIN HCL 500 MG PO TABS
500.0000 mg | ORAL_TABLET | Freq: Two times a day (BID) | ORAL | Status: DC
  Administered 2018-12-30 – 2018-12-31 (×2): 500 mg via ORAL
  Filled 2018-12-30 (×6): qty 1

## 2018-12-30 MED ORDER — INSULIN GLARGINE 100 UNIT/ML ~~LOC~~ SOLN
8.0000 [IU] | Freq: Every day | SUBCUTANEOUS | Status: DC
  Filled 2018-12-30: qty 0.08

## 2018-12-30 NOTE — Care Management (Signed)
CM consult acknowledged for f/u appt needed with a [facility]. CM had arranged a hospital f/u televist appointment with: [facility] on 01/02/19 @ 1430; AVS updated.   Colleen Can RN, BSN, NCM-BC, ACM-RN [phone]

## 2018-12-30 NOTE — Progress Notes (Addendum)
PROGRESS NOTE  Cameron Hampton  AVW:[id number] DOB: August 09, 1966 DOA: 12/21/2018 PCP: None Brief Narrative: Cameron Hampton is a 53 y.o. male with a history of HTN who was exposed to his wife who has a positive COVID-19 infection about 2 weeks ago comes in with fever, sore throat, diarrhea, extreme shortness of breath.  Diagnosed with COVID-19 pneumonia and hypoxic respiratory failure and [facility] to [facility]. Tocilizumab given 5/7.  Subjective: Does report some mild chest discomfort after pruning yesterday, still reports some dyspnea , hypoxic on exertion at 85% .  Asessment & Plan: Active Problems:   Acute respiratory disease due to COVID-19 virus   Essential hypertension  Acute hypoxic respiratory failure due to covid-19 pneumonia:  -Continue with weaning as tolerated, he is currently on room air, he does report some dyspnea, cough, and chest discomfort, he was ambulated, oxygen dropped to 85% on room air, he was encouraged with his incentive spirometry . - Continue lovenox as below - s/p tocilizumab 5/7. - Stopped steroids with improvement in respiratory symptoms and worsening hyperglycemia.  -Wean oxygen as tolerated - Blood cultures negative, s/p CTX, azithromycin 5/6. COVID-19 Labs  Recent Labs    12/29/18 0648  DDIMER 1.04*    Lab Results  Component Value Date   SARSCOV2NAA POSITIVE (A) 12/17/2018     Elevated LFTs, cholelithiasis: ALT predominance, improving 5/12. No RUQ pain. U/S demonstrating cholelithiasis, sludge, and anterior GB wall thickening though he has no tenderness and is tolerating po without pain. No ductal dilatation.  At this point I see no indication for further work-up especially he is asymptomatic, and his LFTs trending down, as this more likely will be related to deceiving Actemra.  Troponin elevation: - Features of pain are atypical for cardiac etiology and troponin is not trending up significantly. This is well-documented in severe courses of covid,  though he does have some rhythm disturbances (sinus bradycardia) and LAFB and nonspecific t-wave changes with unclear significance.  - Echocardiogram does not mention wall motion abnormalities and LVEF is preserved.  - Continue telemetry - Was on intermediate dose lovenox due to well-documented prothrombotic state of covid, transitioned to therapeutic dosing 5/11.  Will transition back to VTE prophylaxis Lovenox dose per COVID-19 protocol 40 mg every 12 hours from this evening. - Started aspirin daily - prn NTG, morphine.  - With LFT's rising, not giving statin currently. LDL 85.  - Previous MD discussed with cardiology, Dr. Antoine PocheHochrein and Dr. Delton SeeNelson. -Patient had 9 beats of V. tach today, will start on low-dose beta-blockers 12.5 mg twice daily, echo already showing preserved EF of 65%  Steroid-induced hyperglycemia and T2DM: HbA1c 7.5% indicating longer term hyperglycemia. This was after only 3 days of steroids.  - Stopped steroids - Continue moderate SSI, HS coverage. - Improving control. -He is currently on Lantus 15 units subcu daily with good p.o.,I have started on metformin overestimate this patient for 2 days.  Pharyngitis: Due to covid/cough. Rapid strep (GAS) and Strep Ag (Pneumococcus) negative. - Antitussives, chloraseptic spray.   HTN:  -Blood pressure is soft, Home medications on hold  Sinus bradycardia with sinus pauses: Mostly while sleeping, otherwise NSR and asymptomatic.  - Stopped beta blocker, improving. - Continue telemetry  Thoracic aortic aneurysm: Incidentally noted at 4.1cm. - Recommend annual imaging followup by CTA or MRA.  History of pancreatitis: Suspected to be due to gallstones, no Hx alcohol use. Now with epigastric/lower chest pain that is atypical for coronary syndrome. - Lipase normal x2.   DVT prophylaxis: Lovenox as  above Code Status: Full Family Communication: D/W patient Disposition Plan: home, when no further hypoxia  Consultants:   None   Procedures:   Tocilizumab 12/22/2018   Antimicrobials:  Ceftriaxone, azithromycin 5/6     Objective: Vitals:   12/30/18 1030 12/30/18 1100 12/30/18 1121 12/30/18 1200  BP: 99/75 95/82  97/80  Pulse: 73 66  65  Resp: (!) 22 (!) 22  (!) 21  Temp:   98.6 F (37 C)   TempSrc:      SpO2: 93% 92%  95%  Weight:      Height:        Intake/Output Summary (Last 24 hours) at 12/30/2018 1320 Last data filed at 12/30/2018 0200 Gross per 24 hour  Intake -  Output 1900 ml  Net -1900 ml   Filed Weights   12/21/18 2044 12/22/18 0200  Weight: 87.1 kg 78.7 kg    Awake Alert, Oriented X 3, No new F.N deficits, Normal affect Symmetrical Chest wall movement, Good air movement bilaterally, CTAB RRR,No Gallops,Rubs or new Murmurs, No Parasternal Heave +ve B.Sounds, Abd Soft, No tenderness, No rebound - guarding or rigidity. No Cyanosis, Clubbing or edema, No new Rash or bruise       Data Reviewed: I have personally reviewed following labs and imaging studies       CBC: Recent Labs  Lab 12/24/18 0449 12/25/18 0115 12/26/18 0430 12/27/18 0441 12/28/18 0500 12/29/18 0648  WBC 14.8* 12.5* 13.7* 10.4 10.1 8.8  NEUTROABS 13.8* 11.3* 11.4*  --   --   --   HGB 14.7 15.0 15.2 16.3 15.9 15.6  HCT 44.3 46.5 46.1 49.2 48.2 47.3  MCV 90.4 91.4 90.7 91.3 91.6 91.5  PLT 487* 566* 543* 548* 563* 549*   Basic Metabolic Panel: Recent Labs  Lab 12/24/18 0449 12/25/18 0925 12/26/18 0430 12/27/18 0441 12/29/18 0648  NA 139 138 138 138 137  K 4.2 4.8 4.2 4.0 4.5  CL 100 101 100 100 100  CO2 GLUCOSE 239* 279* 118* 116* 109*  BUN 23* 23* 23* 29* 23*  CREATININE 0.64 0.58* 0.73 0.71 0.83  CALCIUM 8.7* 8.8* 8.7* 8.6* 8.5*  MG 2.2  --  2.4  --   --    GFR: Estimated Creatinine Clearance: 111.7 mL/min (by C-G formula based on SCr of 0.83 mg/dL). Liver Function Tests: Recent Labs  Lab 12/24/18 0449 12/25/18 0925 12/25/18 1515 12/26/18 0430 12/27/18 0441 12/29/18  0648  AST 91* 26  --  154* 45* 27  ALT 130* 252*  --  390* 251* 113*  ALKPHOS 181* 206*  --  216* 198* 140*  BILITOT 0.6 2.5* 2.1* 1.3* 1.0 1.0  PROT 6.5 6.5  --  6.5 6.5 5.9*  ALBUMIN 2.7* 2.8*  --  2.9* 3.0* 2.8*   Recent Labs  Lab 12/24/18 1310 12/25/18 1515  LIPASE 46 38   Lipid Profile: No results for input(s): CHOL, HDL, LDLCALC, TRIG, CHOLHDL, LDLDIRECT in the last 72 hours. Anemia Panel: No results for input(s): VITAMINB12, FOLATE, FERRITIN, TIBC, IRON, RETICCTPCT in the last 72 hours. Recent Results (from the past 240 hour(s))  Blood Culture (routine x 2)     Status: None   Collection Time: 12/21/18 10:36 PM  Result Value Ref Range Status   Specimen Description   Final    BLOOD RIGHT FOREARM Performed at [facility], [address] [place], [place] [address]    Special Requests   Final  BOTTLES DRAWN AEROBIC AND ANAEROBIC Blood Culture adequate volume Performed at [facility], [address], [place], [place] [address]    Culture   Final    NO GROWTH 5 DAYS Performed at [facility], [address] [place], [place] [address]    Report Status 12/26/2018 FINAL  Final  Blood Culture (routine x 2)     Status: None   Collection Time: 12/21/18 10:36 PM  Result Value Ref Range Status   Specimen Description   Final    BLOOD LEFT FOREARM Performed at [facility], [address] [place], [place] [address]    Special Requests   Final    BOTTLES DRAWN AEROBIC AND ANAEROBIC Blood Culture adequate volume Performed at [facility], [address], [place], [place] [address]    Culture   Final    NO GROWTH 5 DAYS Performed at [facility], [address] [place], [place] [address]    Report Status 12/26/2018 FINAL  Final  Group A Strep by PCR     Status: Abnormal   Collection Time: 12/22/18 11:30 AM  Result Value Ref Range Status   Group A Strep by PCR NEGATIVE (A) NOT DETECTED Final    Comment:  Performed at [facility], [address] [place], [place] [address]      Radiology Studies: No results found.  Scheduled Meds: . aspirin EC  81 mg Oral Daily  . bacitracin   Topical BID  . chlorhexidine  15 mL Mouth Rinse BID  . enoxaparin (LOVENOX) injection  40 mg Subcutaneous Q12H  . famotidine  20 mg Oral BID  . insulin aspart  0-15 Units Subcutaneous TID WC  . insulin aspart  0-5 Units Subcutaneous QHS  . insulin aspart  3 Units Subcutaneous TID WC  . insulin glargine  15 Units Subcutaneous Daily  . mouth rinse  15 mL Mouth Rinse q12n4p  . metoprolol tartrate  12.5 mg Oral BID  . pantoprazole  40 mg Oral Daily  . sodium chloride flush  3 mL Intravenous Q12H  . sodium chloride flush  3 mL Intravenous Q12H  . vitamin C  500 mg Oral Daily  . zinc sulfate  220 mg Oral Daily   Continuous Infusions: . sodium chloride Stopped (12/23/18 1400)     LOS: 8 days   Time spent: 25 minutes.  Huey Bienenstock, MD [facility] [email or web address] Password [email or web address] 12/30/2018, 1:20 PM

## 2018-12-30 NOTE — Progress Notes (Signed)
CardioVascular Research Department and AHF Team  ReDS Research Project   Patient #: [id number]  ReDS Measurement  Right: 32 %  Left: 35 %

## 2018-12-30 NOTE — Plan of Care (Signed)
Patient stable, discussed POC with patient via interpreter. Patient ambulated w/o difficulty on RA, VSS, NAD noted. Patient educated on prone positioning when in bed/OOB to chair during the day, incentive spirometry/flutter valve exercises utilizing teach back method, agreeable with plan, denies question/concerns at this time. Attempted to contact family w/updates, unable to reach at this time.

## 2018-12-31 DIAGNOSIS — U071 COVID-19: Secondary | ICD-10-CM | POA: Diagnosis not present

## 2018-12-31 DIAGNOSIS — I10 Essential (primary) hypertension: Secondary | ICD-10-CM | POA: Diagnosis not present

## 2018-12-31 DIAGNOSIS — J069 Acute upper respiratory infection, unspecified: Secondary | ICD-10-CM | POA: Diagnosis not present

## 2018-12-31 DIAGNOSIS — R945 Abnormal results of liver function studies: Secondary | ICD-10-CM | POA: Diagnosis not present

## 2018-12-31 LAB — GLUCOSE, CAPILLARY
Glucose-Capillary: 156 mg/dL — ABNORMAL HIGH (ref 70–99)
Glucose-Capillary: 138 mg/dL — ABNORMAL HIGH (ref 70–99)

## 2018-12-31 MED ORDER — ASPIRIN 81 MG PO TBEC: 81.0000 mg | DELAYED_RELEASE_TABLET | Freq: Every day | ORAL | 0 refills | Status: AC

## 2018-12-31 MED ORDER — FAMOTIDINE 20 MG PO TABS: 20.0000 mg | ORAL_TABLET | Freq: Two times a day (BID) | ORAL | 0 refills | Status: AC

## 2018-12-31 MED ORDER — METFORMIN HCL 500 MG PO TABS: 500.0000 mg | ORAL_TABLET | Freq: Two times a day (BID) | ORAL | 0 refills | Status: AC

## 2018-12-31 MED ORDER — METOPROLOL TARTRATE 25 MG PO TABS: 12.5000 mg | ORAL_TABLET | Freq: Two times a day (BID) | ORAL | 1 refills | Status: AC

## 2018-12-31 MED ORDER — ASCORBIC ACID 500 MG PO TABS: 500.0000 mg | ORAL_TABLET | Freq: Every day | ORAL | Status: AC

## 2018-12-31 MED ORDER — ACETAMINOPHEN 325 MG PO TABS: 650.0000 mg | ORAL_TABLET | Freq: Four times a day (QID) | ORAL | Status: AC | PRN

## 2018-12-31 MED ORDER — ZINC SULFATE 220 (50 ZN) MG PO CAPS: 220.0000 mg | ORAL_CAPSULE | Freq: Every day | ORAL | Status: AC

## 2018-12-31 NOTE — Progress Notes (Signed)
CardioVascular Research Department and AHF Team  ReDS Research Project   Patient #: [id number]  ReDS Measurement  Right: 39 %  Left: 38 %

## 2018-12-31 NOTE — Plan of Care (Signed)
PT AWAITING DISCHARGE

## 2018-12-31 NOTE — Discharge Summary (Signed)
Cameron Hampton, is a 53 y.o. male  DOB 1965/12/29  MRN [id number].  Admission date:  12/21/2018  Admitting Physician  Therisa DoyneAnastassia Doutova, MD  Discharge Date:  12/31/2018   Primary MD  Patient, No Pcp Per  Recommendations for primary care physician for things to follow:  - please check CBC, BMP, LFTs during next visit. - Recommend annual imaging followup by CTA or MRA. For Thoracic aortic aneurysm: Incidentally noted at 4.1cm.  Admission Diagnosis  COVID-19 virus infection [U07.1]   Discharge Diagnosis  COVID-19 virus infection [U07.1]    Active Problems:   Acute respiratory disease due to COVID-19 virus   Essential hypertension      Past Medical History:  Diagnosis Date   Hypertension     History reviewed. No pertinent surgical history.     History of present illness and  Hospital Course:     Kindly see H&P for history of present illness and admission details, please review complete Labs, Consult reports and Test reports for all details in brief  HPI  from the history and physical done on the day of admission  Cameron Hampton is a 53 y.o. male with medical history significant of known COVID 19 infection,  HTN    Presented with   hypoxemia emergency department on 2 May with fevers and ongoing cough headache myalgias and diarrhea decreased p.o. intake. His wife was recently diagnosed with COVID-19 He was found to be positive for COVID-19 on 2 May.  Was found to have left lower lung infiltrate and started on Levaquin.  He was not hypoxic at that time and was able to be discharged home.  Since then he has had worsening nausea vomiting and worsens of breath Patient was brought from home today requiring non-rebreather satting 80% on arrival No chest pain  Hospital Course   Cameron Hampton is a 53 y.o. male with a history of HTN who was exposed to his wife who has a positive  COVID-19 infection about 2 weeks ago comes in with fever, sore throat, diarrhea, extreme shortness of breath. Diagnosed with COVID-19 pneumonia and hypoxic respiratory failure and [facility]. Tocilizumab given 5/7.  Acute hypoxic respiratory failure due to covid-19 pneumonia:  -Requiring oxygen initially, he is currently on room air, h- s/p tocilizumab 5/7. - Stopped steroids with improvement in respiratory symptoms and worsening hyperglycemia.  - Blood cultures negative, s/p CTX, azithromycin 5/6. COVID-19 Labs  RecentLabs(last2labs)     Recent Labs    12/29/18 0648  DDIMER 1.04*      RecentLabs       Lab Results  Component Value Date   SARSCOV2NAA POSITIVE (A) 12/17/2018       Elevated LFTs, cholelithiasis: ALT predominance, improving 5/12. No RUQ pain. U/S demonstrating cholelithiasis, sludge, and anterior GB wall thickening though he has no tenderness and is tolerating po without pain. No ductal dilatation.  At this point I see no indication for further work-up especially he is asymptomatic, and his LFTs trending down, as this more likely will be  related to deceiving Actemra.  Troponin elevation: - Features of pain are atypical for cardiac etiology and troponin is not trending up significantly. This is well-documented in severe courses of covid, though he does have some rhythm disturbances (sinus bradycardia) and LAFB and nonspecific t-wave changes with unclear significance.  - Echocardiogram does not mention wall motion abnormalities and LVEF is preserved.  - Continue telemetry - Was on intermediate dose lovenox due to well-documented prothrombotic state of covid, transitioned to therapeutic dosing 5/11.  Will transition back to VTE prophylaxis Lovenox dose per COVID-19 protocol 40 mg every 12 hours from this evening. - Started aspirin daily - With LFT's rising, not giving statin currently. LDL 85.  - Previous MD discussed with cardiology, Dr. Antoine Poche and  Dr. Delton See. -Patient had 9 beats of V. tach today, will start on low-dose beta-blockers 12.5 mg twice daily, echo already showing preserved EF of 65%  Steroid-induced hyperglycemia and T2DM: HbA1c 7.5% indicating longer term hyperglycemia. This was after only 3 days of steroids.  - Stopped steroids - Continue moderate SSI, HS coverage.  During hospital stay, patient was started on metformin before discharge.  Pharyngitis: Due to covid/cough. Rapid strep (GAS) and Strep Ag (Pneumococcus) negative. - Antitussives, chloraseptic spray.   HTN:  -To be on propranolol at home, he will be discharged on low-dose metoprolol  Sinus bradycardia with sinus pauses: Mostly while sleeping, otherwise NSR and asymptomatic.  - Stopped beta blocker, improving. - Continue telemetry  Thoracic aortic aneurysm: Incidentally noted at 4.1cm. - Recommend annual imaging followup by CTA or MRA.  History of pancreatitis: Suspected to be due to gallstones, no Hx alcohol use. Now with epigastric/lower chest pain that is atypical for coronary syndrome. - Lipase normal x2.    Discharge Condition:  Stable   Follow UP  Follow-up Information    [facility] Follow up on 01/02/2019.   Why:  Televisit: Office will call at time of appointment: 2:30pm Contact information: [address] [place] [phone]            Discharge Instructions  and  Discharge Medications    Discharge Instructions    Discharge instructions   Complete by:  As directed    Follow with [facility]   Activity: As tolerated with Full fall precautions use walker/cane & assistance as needed   Disposition Home    Diet: Heart Healthy , Carb modified, with feeding assistance and aspiration precautions.  For Heart failure patients - Check your Weight same time everyday, if you gain over 2 pounds, or you develop in leg swelling, experience more shortness  of breath or chest pain, call your Primary MD immediately. Follow Cardiac Low Salt Diet and 1.5 lit/day fluid restriction.   On your next visit with your primary care physician please Get Medicines reviewed and adjusted.   Please request your Prim.MD to go over all Hospital Tests and Procedure/Radiological results at the follow up, please get all Hospital records sent to your Prim MD by signing hospital release before you go home.   If you experience worsening of your admission symptoms, develop shortness of breath, life threatening emergency, suicidal or homicidal thoughts you must seek medical attention immediately by calling 911 or calling your MD immediately  if symptoms less severe.  You Must read complete instructions/literature along with all the possible adverse reactions/side effects for all the Medicines you take and that have been prescribed to you. Take any new Medicines after you  have completely understood and accpet all the possible adverse reactions/side effects.   Do not drive, operating heavy machinery, perform activities at heights, swimming or participation in water activities or provide baby sitting services if your were admitted for syncope or siezures until you have seen by Primary MD or a Neurologist and advised to do so again.  Do not drive when taking Pain medications.    Do not take more than prescribed Pain, Sleep and Anxiety Medications  Special Instructions: If you have smoked or chewed Tobacco  in the last 2 yrs please stop smoking, stop any regular Alcohol  and or any Recreational drug use.  Wear Seat belts while driving.   Please note  You were cared for by a hospitalist during your hospital stay. If you have any questions about your discharge medications or the care you received while you were in the hospital after you are discharged, you can call the unit and asked to speak with the hospitalist on call if the hospitalist that took care of you is not  available. Once you are discharged, your primary care physician will handle any further medical issues. Please note that NO REFILLS for any discharge medications will be authorized once you are discharged, as it is imperative that you return to your primary care physician (or establish a relationship with a primary care physician if you do not have one) for your aftercare needs so that they can reassess your need for medications and monitor your lab values.   Increase activity slowly   Complete by:  As directed      Allergies as of 12/31/2018   No Known Allergies     Medication List    STOP taking these medications   ibuprofen 200 MG tablet Commonly known as:  ADVIL   levofloxacin 500 MG tablet Commonly known as:  LEVAQUIN   propranolol 40 MG tablet Commonly known as:  INDERAL     TAKE these medications   acetaminophen 325 MG tablet Commonly known as:  TYLENOL Take 2 tablets (650 mg total) by mouth every 6 (six) hours as needed for mild pain or headache (fever >/= 101).   ascorbic acid 500 MG tablet Commonly known as:  VITAMIN C Take 1 tablet (500 mg total) by mouth daily. PLEASE TAKE FOR 10 DAYS Start taking on:  Jan 01, 2019   aspirin 81 MG EC tablet Take 1 tablet (81 mg total) by mouth daily. Start taking on:  Jan 01, 2019   famotidine 20 MG tablet Commonly known as:  PEPCID Take 1 tablet (20 mg total) by mouth 2 (two) times daily.   metFORMIN 500 MG tablet Commonly known as:  GLUCOPHAGE Take 1 tablet (500 mg total) by mouth 2 (two) times daily with a meal.   metoprolol tartrate 25 MG tablet Commonly known as:  LOPRESSOR Take 0.5 tablets (12.5 mg total) by mouth 2 (two) times daily.   zinc sulfate 220 (50 Zn) MG capsule Take 1 capsule (220 mg total) by mouth daily. PLEASE TAKE FOR 10 DAYS Start taking on:  Jan 01, 2019         Diet and Activity recommendation: See Discharge Instructions above   Consults obtained - None   Major procedures and Radiology  Reports - PLEASE review detailed and final reports for all details, in brief -      Ct Angio Chest Pe W Or Wo Contrast  Result Date: 12/22/2018 CLINICAL DATA:  Positive D-dimer.  Hypoxemia.  Positive for COVID-19  EXAM: CT ANGIOGRAPHY CHEST WITH CONTRAST TECHNIQUE: Multidetector CT imaging of the chest was performed using the standard protocol during bolus administration of intravenous contrast. Multiplanar CT image reconstructions and MIPs were obtained to evaluate the vascular anatomy. CONTRAST:  OMNIPAQUE IOHEXOL 350 MG/ML SOLN COMPARISON:  Chest x-ray 12/21/2018 FINDINGS: Cardiovascular: Heart is mildly enlarged. Mild aneurysmal dilatation of the ascending thoracic aorta, 4.1 cm. No filling defects in the pulmonary arteries to suggest pulmonary emboli. Mediastinum/Nodes: No mediastinal, hilar, or axillary adenopathy. Lungs/Pleura: Patchy ground-glass opacities throughout both lungs, predominantly dependent but also noted in the apices. No effusions. This likely related to COVID-19. Upper Abdomen: Imaging into the upper abdomen shows no acute findings. Musculoskeletal: Chest wall soft tissues are unremarkable. No acute bony abnormality. Review of the MIP images confirms the above findings. IMPRESSION: Extensive patchy bilateral ground-glass airspace opacities, likely related to patient's known COVID-19. No evidence of pulmonary embolus. 4.1 cm ascending thoracic aortic aneurysm. Recommend annual imaging followup by CTA or MRA. This recommendation follows 2010 ACCF/AHA/AATS/ACR/ASA/SCA/SCAI/SIR/STS/SVM Guidelines for the Diagnosis and Management of Patients with Thoracic Aortic Disease. Circulation. 2010; 121: G881-J031. Aortic aneurysm NOS (ICD10-I71.9) Electronically Signed   By: Charlett Nose M.D.   On: 12/22/2018 02:42   Dg Chest Port 1 View  Result Date: 12/25/2018 CLINICAL DATA:  +COVID EXAM: PORTABLE CHEST 1 VIEW COMPARISON:  Radiograph 5 8 20  FINDINGS: Normal cardiac silhouette. Patchy  diffuse airspace disease in the lingula / LEFT lower lobe and lateral aspect of the RIGHT upper lobe and RIGHT lower lobe. No significant change in airspace pattern. Slight improvement aeration lung bases. Normal cardiac silhouette. IMPRESSION: 1. Some improvement in ventilation to the lung bases. 2. Diffuse airspace disease in the lingula / LEFT lower lobe and lateral aspect of the RIGHT lung similar to prior. Electronically Signed   By: Genevive Bi M.D.   On: 12/25/2018 04:49   Dg Chest Port 1 View  Result Date: 12/23/2018 CLINICAL DATA:  Shortness of breath.  COVID-19 EXAM: PORTABLE CHEST 1 VIEW COMPARISON:  CT from yesterday FINDINGS: Patchy bilateral airspace disease from viral pneumonia/ARDS. There is mild progression since yesterday, most convincing at the right upper lobe. No edema, effusion, or pneumothorax. Normal heart size. IMPRESSION: ARDS/pneumonia with mild worsening compared to yesterday. Electronically Signed   By: Marnee Spring M.D.   On: 12/23/2018 04:48   Dg Chest Port 1 View  Result Date: 12/21/2018 CLINICAL DATA:  Shortness of breath and cough EXAM: PORTABLE CHEST 1 VIEW COMPARISON:  12/17/2018 FINDINGS: Bibasilar ground-glass opacities. These are increased as compared with 12/17/2018. Stable cardiomediastinal silhouette. No pneumothorax. IMPRESSION: Worsening bibasilar ground-glass opacity/pneumonias. Electronically Signed   By: Jasmine Pang M.D.   On: 12/21/2018 21:11   Dg Chest Port 1 View  Result Date: 12/17/2018 CLINICAL DATA:  Dyspnea, spouse is COVID-19 positive EXAM: PORTABLE CHEST 1 VIEW COMPARISON:  None. FINDINGS: Normal heart size. Normal mediastinal contour. No pneumothorax. No pleural effusion. Mild patchy left lung base opacity. IMPRESSION: Mild patchy left lung base opacity compatible with pneumonia. Chest radiograph follow-up advised. Electronically Signed   By: Delbert Phenix M.D.   On: 12/17/2018 09:54   US Abdomen Limited Ruq  Result Date:  12/26/2018 CLINICAL DATA:  Elevated liver enzymes.  COVID-19 positive EXAM: ULTRASOUND ABDOMEN LIMITED RIGHT UPPER QUADRANT COMPARISON:  None. FINDINGS: Gallbladder: No gallstones or wall thickening visualized within the gallbladder, there is an echogenic focus measuring 2.2 cm in length consistent with either one large or multiple adherent gallstones. There is extensive  sludge in the gallbladder as well. There is thickening of the gallbladder wall along a portion of its anterior aspect. There is no pericholecystic fluid. No focal tenderness is noted over the gallbladder with transducer period. Common bile duct: Diameter: 5 mm. No intrahepatic or extrahepatic biliary duct dilatation. Liver: No focal lesion identified. Liver echogenicity is within normal limits. Portal vein is patent on color Doppler imaging with normal direction of blood flow towards the liver. IMPRESSION: Cholelithiasis and sludge within the gallbladder. There is wall thickening along a portion of the anterior wall of the gallbladder. Question a degree of acute cholecystitis given this combination of findings. In this regard, it may be reasonable to consider nuclear medicine hepatobiliary imaging study to assess for cystic duct patency. Study otherwise unremarkable. Electronically Signed   By: Bretta Bang III M.D.   On: 12/26/2018 11:34    Micro Results    Recent Results (from the past 240 hour(s))  Blood Culture (routine x 2)     Status: None   Collection Time: 12/21/18 10:36 PM  Result Value Ref Range Status   Specimen Description   Final    BLOOD RIGHT FOREARM Performed at [facility], [address], [place], [place] [address]    Special Requests   Final    BOTTLES DRAWN AEROBIC AND ANAEROBIC Blood Culture adequate volume Performed at [facility], [address], [place], [place] [address]    Culture   Final    NO GROWTH 5 DAYS Performed at [facility], [address]  [place], [place] [address]    Report Status 12/26/2018 FINAL  Final  Blood Culture (routine x 2)     Status: None   Collection Time: 12/21/18 10:36 PM  Result Value Ref Range Status   Specimen Description   Final    BLOOD LEFT FOREARM Performed at [facility], [address], [place], [place] [address]    Special Requests   Final    BOTTLES DRAWN AEROBIC AND ANAEROBIC Blood Culture adequate volume Performed at [facility], [address], [place], [place] [address]    Culture   Final    NO GROWTH 5 DAYS Performed at [facility], [address] [place], [place] [address]    Report Status 12/26/2018 FINAL  Final  Group A Strep by PCR     Status: Abnormal   Collection Time: 12/22/18 11:30 AM  Result Value Ref Range Status   Group A Strep by PCR NEGATIVE (A) NOT DETECTED Final    Comment: Performed at [facility], [address], [place], [place] [address]       Today   Subjective:   Cameron Hampton today has no headache,no chest abdominal pain,no new weakness tingling or numbness, feels much better wants to go home today.   Objective:   Blood pressure 100/71, pulse 67, temperature 97.7 F (36.5 C), temperature source Oral, resp. rate 19, height 6' (1.829 m), weight 78.7 kg, SpO2 95 %.   Intake/Output Summary (Last 24 hours) at 12/31/2018 1039 Last data filed at 12/31/2018 0750 Gross per 24 hour  Intake --  Output 500 ml  Net -500 ml    Exam Awake Alert, Oriented x 3, No new F.N deficits, Normal affect Symmetrical Chest wall movement, Good air movement bilaterally, CTAB RRR,No Gallops,Rubs or new Murmurs, No Parasternal Heave +ve B.Sounds, Abd Soft, Non tender, No organomegaly appriciated, No rebound -guarding or rigidity. No  Cyanosis, Clubbing or edema, No new Rash or bruise  Data Review   CBC w Diff:  Lab Results  Component Value Date   WBC 8.8 12/29/2018   HGB 15.6 12/29/2018   HCT 47.3 12/29/2018   PLT 549 (H)  12/29/2018   LYMPHOPCT 8 12/26/2018   MONOPCT 7 12/26/2018   EOSPCT 0 12/26/2018   BASOPCT 0 12/26/2018    CMP:  Lab Results  Component Value Date   NA 137 12/29/2018   K 4.5 12/29/2018   CL 100 12/29/2018   CO2 28 12/29/2018   BUN 23 (H) 12/29/2018   CREATININE 0.83 12/29/2018   PROT 5.9 (L) 12/29/2018   ALBUMIN 2.8 (L) 12/29/2018   BILITOT 1.0 12/29/2018   ALKPHOS 140 (H) 12/29/2018   AST 27 12/29/2018   ALT 113 (H) 12/29/2018  .   Total Time in preparing paper work, data evaluation and todays exam - 35 minutes  Huey Bienenstock M.D on 12/31/2018 at 10:39 AM  [facility]   Office  [phone]

## 2018-12-31 NOTE — Discharge Instructions (Signed)
Person Under Monitoring Name: Cameron Hampton  Location: [address] [place] [place] [address]   Infection Prevention Recommendations for Individuals Confirmed to have, or Being Evaluated for, 2019 Novel Coronavirus (COVID-19) Infection Who Receive Care at Home  Individuals who are confirmed to have, or are being evaluated for, COVID-19 should follow the prevention steps below until a healthcare provider or local or state health department says they can return to normal activities.  Stay home except to get medical care You should restrict activities outside your home, except for getting medical care. Do not go to work, school, or public areas, and do not use public transportation or taxis.  Call ahead before visiting your doctor Before your medical appointment, call the healthcare provider and tell them that you have, or are being evaluated for, COVID-19 infection. This will help the healthcare providers office take steps to keep other people from getting infected. Ask your healthcare provider to call the local or state health department.  Monitor your symptoms Seek prompt medical attention if your illness is worsening (e.g., difficulty breathing). Before going to your medical appointment, call the healthcare provider and tell them that you have, or are being evaluated for, COVID-19 infection. Ask your healthcare provider to call the local or state health department.  Wear a facemask You should wear a facemask that covers your nose and mouth when you are in the same room with other people and when you visit a healthcare provider. People who live with or visit you should also wear a facemask while they are in the same room with you.  Separate yourself from other people in your home As much as possible, you should stay in a different room from other people in your home. Also, you should use a separate bathroom, if available.  Avoid sharing household items You should  not share dishes, drinking glasses, cups, eating utensils, towels, bedding, or other items with other people in your home. After using these items, you should wash them thoroughly with soap and water.  Cover your coughs and sneezes Cover your mouth and nose with a tissue when you cough or sneeze, or you can cough or sneeze into your sleeve. Throw used tissues in a lined trash can, and immediately wash your hands with soap and water for at least 20 seconds or use an alcohol-based hand rub.  Wash your [employer] your hands often and thoroughly with soap and water for at least 20 seconds. You can use an alcohol-based hand sanitizer if soap and water are not available and if your hands are not visibly dirty. Avoid touching your eyes, nose, and mouth with unwashed hands.   Prevention Steps for Caregivers and Household Members of Individuals Confirmed to have, or Being Evaluated for, COVID-19 Infection Being Cared for in the Home  If you live with, or provide care at home for, a person confirmed to have, or being evaluated for, COVID-19 infection please follow these guidelines to prevent infection:  Follow healthcare providers instructions Make sure that you understand and can help the patient follow any healthcare provider instructions for all care.  Provide for the patients basic needs You should help the patient with basic needs in the home and provide support for getting groceries, prescriptions, and other personal needs.  Monitor the patients symptoms If they are getting sicker, call his or her medical provider and tell them that the patient has, or is being evaluated for, COVID-19 infection. This will help the healthcare providers  office take steps to keep other people from getting infected. Ask the healthcare provider to call the local or state health department.  Limit the number of people who have contact with the patient  If possible, have only one caregiver for the  patient.  Other household members should stay in another home or place of residence. If this is not possible, they should stay  in another room, or be separated from the patient as much as possible. Use a separate bathroom, if available.  Restrict visitors who do not have an essential need to be in the home.  Keep older adults, very young children, and other sick people away from the patient Keep older adults, very young children, and those who have compromised immune systems or chronic health conditions away from the patient. This includes people with chronic heart, lung, or kidney conditions, diabetes, and cancer.  Ensure good ventilation Make sure that shared spaces in the home have good air flow, such as from an air conditioner or an opened window, weather permitting.  Wash your hands often  Wash your hands often and thoroughly with soap and water for at least 20 seconds. You can use an alcohol based hand sanitizer if soap and water are not available and if your hands are not visibly dirty.  Avoid touching your eyes, nose, and mouth with unwashed hands.  Use disposable paper towels to dry your hands. If not available, use dedicated cloth towels and replace them when they become wet.  Wear a facemask and gloves  Wear a disposable facemask at all times in the room and gloves when you touch or have contact with the patients blood, body fluids, and/or secretions or excretions, such as sweat, saliva, sputum, nasal mucus, vomit, urine, or feces.  Ensure the mask fits over your nose and mouth tightly, and do not touch it during use.  Throw out disposable facemasks and gloves after using them. Do not reuse.  Wash your hands immediately after removing your facemask and gloves.  If your personal clothing becomes contaminated, carefully remove clothing and launder. Wash your hands after handling contaminated clothing.  Place all used disposable facemasks, gloves, and other waste in a lined  container before disposing them with other household waste.  Remove gloves and wash your hands immediately after handling these items.  Do not share dishes, glasses, or other household items with the patient  Avoid sharing household items. You should not share dishes, drinking glasses, cups, eating utensils, towels, bedding, or other items with a patient who is confirmed to have, or being evaluated for, COVID-19 infection.  After the person uses these items, you should wash them thoroughly with soap and water.  Wash laundry thoroughly  Immediately remove and wash clothes or bedding that have blood, body fluids, and/or secretions or excretions, such as sweat, saliva, sputum, nasal mucus, vomit, urine, or feces, on them.  Wear gloves when handling laundry from the patient.  Read and follow directions on labels of laundry or clothing items and detergent. In general, wash and dry with the warmest temperatures recommended on the label.  Clean all areas the individual has used often  Clean all touchable surfaces, such as counters, tabletops, doorknobs, bathroom fixtures, toilets, phones, keyboards, tablets, and bedside tables, every day. Also, clean any surfaces that may have blood, body fluids, and/or secretions or excretions on them.  Wear gloves when cleaning surfaces the patient has come in contact with.  Use a diluted bleach solution (e.g., dilute bleach with 1  part bleach and 10 parts water) or a household disinfectant with a label that says EPA-registered for coronaviruses. To make a bleach solution at home, add 1 tablespoon of bleach to 1 quart (4 cups) of water. For a larger supply, add  cup of bleach to 1 gallon (16 cups) of water.  Read labels of cleaning products and follow recommendations provided on product labels. Labels contain instructions for safe and effective use of the cleaning product including precautions you should take when applying the product, such as wearing gloves or  eye protection and making sure you have good ventilation during use of the product.  Remove gloves and wash hands immediately after cleaning.  Monitor yourself for signs and symptoms of illness Caregivers and household members are considered close contacts, should monitor their health, and will be asked to limit movement outside of the home to the extent possible. Follow the monitoring steps for close contacts listed on the symptom monitoring form.   ? If you have additional questions, contact your local health department or call the epidemiologist on call at [phone] (available 24/7). ? This guidance is subject to change. For the most up-to-date guidance from [facility], please refer to their website: [email or web address]

## 2018-12-31 NOTE — Progress Notes (Signed)
Called patients wife while in room to update on his status. Patient interpretered via RN phone, clinical update given to his spouse

## 2019-01-02 ENCOUNTER — Inpatient Hospital Stay: Payer: Self-pay | Admitting: Primary Care

## 2019-01-02 LAB — GLUCOSE, CAPILLARY
Glucose-Capillary: 179 mg/dL — ABNORMAL HIGH (ref 70–99)
Glucose-Capillary: 75 mg/dL (ref 70–99)

## 2019-01-04 ENCOUNTER — Telehealth (INDEPENDENT_AMBULATORY_CARE_PROVIDER_SITE_OTHER): Payer: Self-pay | Admitting: General Practice

## 2019-01-04 NOTE — Telephone Encounter (Signed)
No answer, texted link to [employer] app for [facility] Monitoring

## 2019-01-16 ENCOUNTER — Telehealth: Payer: Self-pay | Admitting: *Deleted

## 2019-01-16 NOTE — Telephone Encounter (Signed)
I called him using the Spanish interpreter with [facility] [employer] # [phone].  I asked him if he would be willing to donate plasma to help in the recovery of others with the COVID-19. He said,  "I'm still not feeling well".   "I'm still recovering so I don't want to donate".  I told him I hoped he felt better.

## 2020-12-19 IMAGING — CT CT ANGIOGRAPHY CHEST
2 of 6 series · 18 of 46 positions shown · IV contrast (OMNIPAQUE)
Comparison: Chest x-ray 12/21/2018

CLINICAL DATA: Positive D-dimer.  Hypoxemia.  Positive for [id number]

EXAM:
CT ANGIOGRAPHY CHEST WITH CONTRAST
TECHNIQUE: Multidetector CT imaging of the chest was performed using the
standard protocol during bolus administration of intravenous
contrast. Multiplanar CT image reconstructions and MIPs were
obtained to evaluate the vascular anatomy.
CONTRAST:  100mL OMNIPAQUE IOHEXOL 350 MG/ML SOLN

[Series 6: thins · axial · 0.73mm/px · z∈[-1032,-773]mm · 16 of 285 slices shown]
[im 13/285  lung]
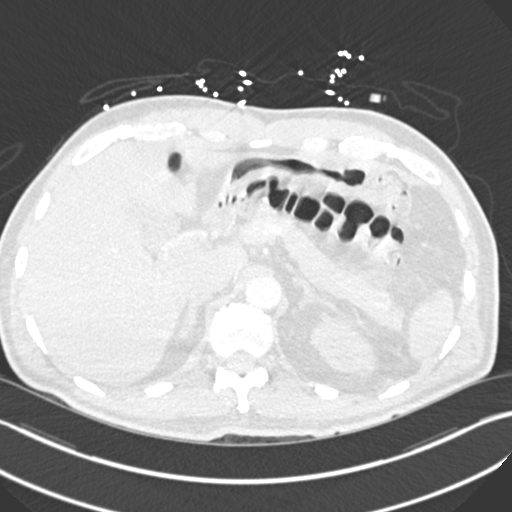
[im 38/285  soft-tissue]
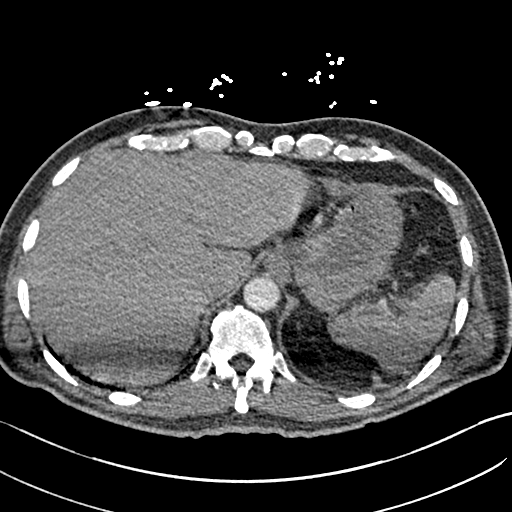
[im 50/285  lung]
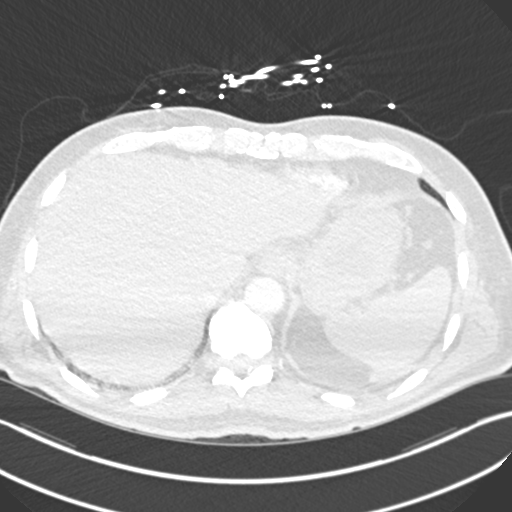
[im 62/285  soft-tissue]
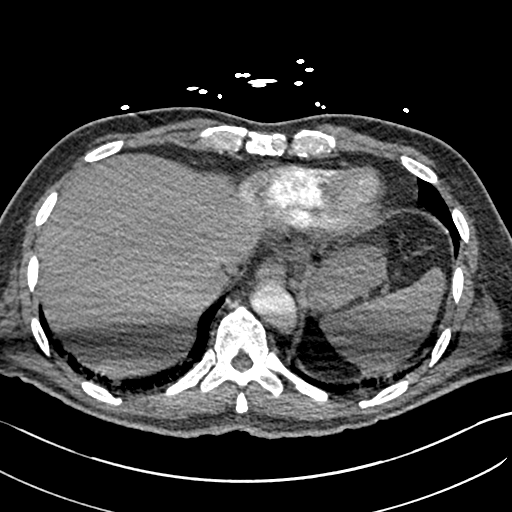
[im 87/285  lung]
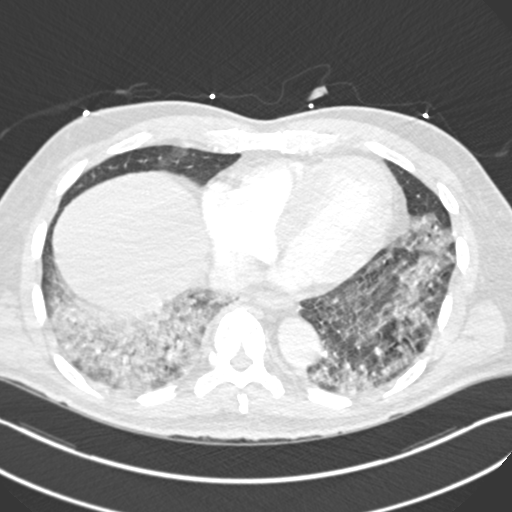
[im 99/285  soft-tissue]
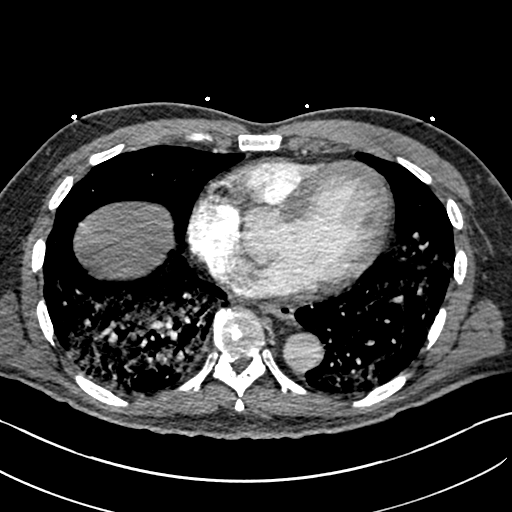
[im 112/285  lung]
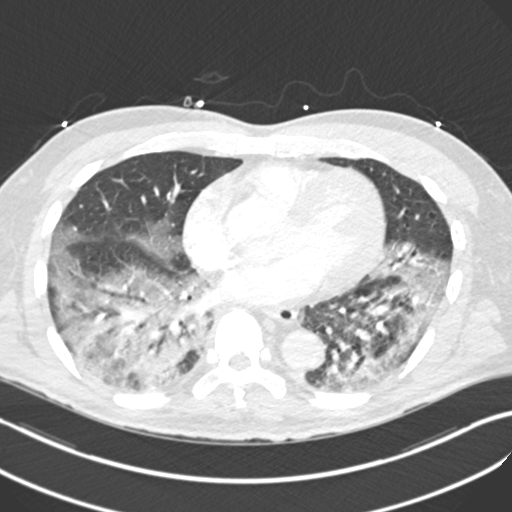
[im 136/285  soft-tissue]
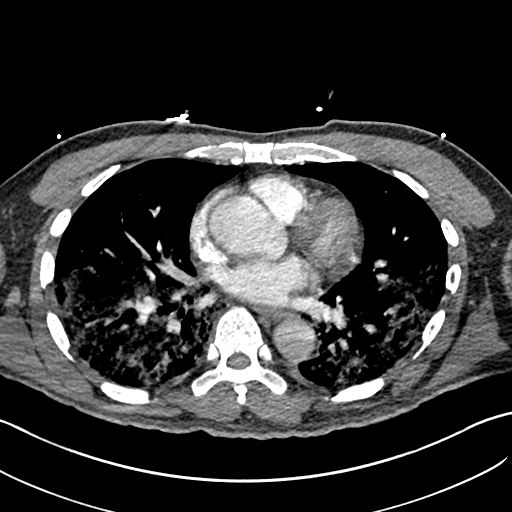
[im 149/285  lung]
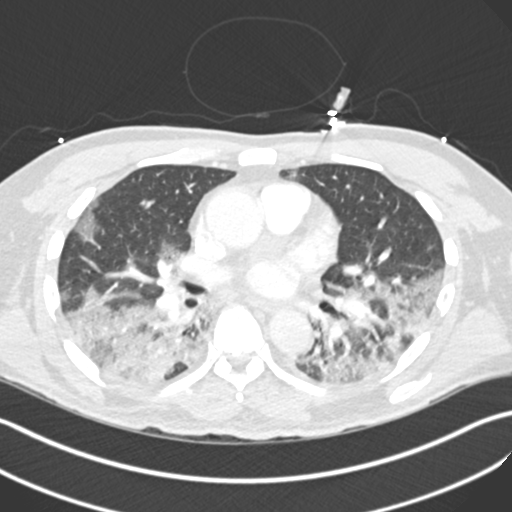
[im 173/285  soft-tissue]
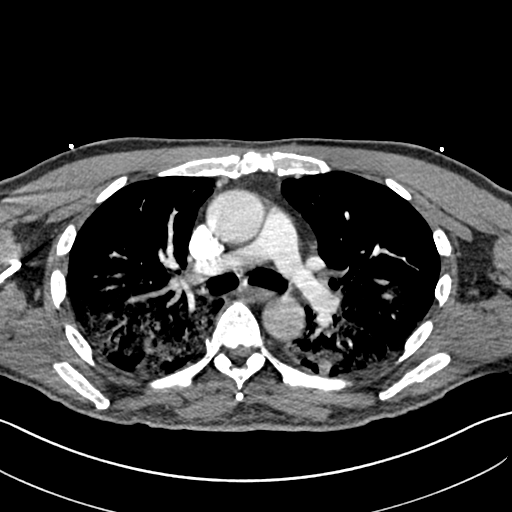
[im 186/285  lung]
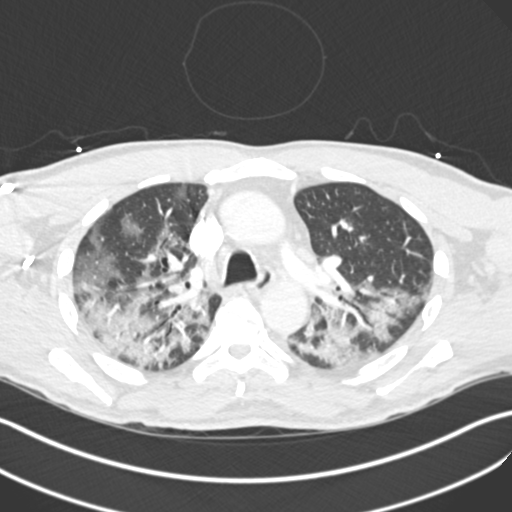
[im 198/285  soft-tissue]
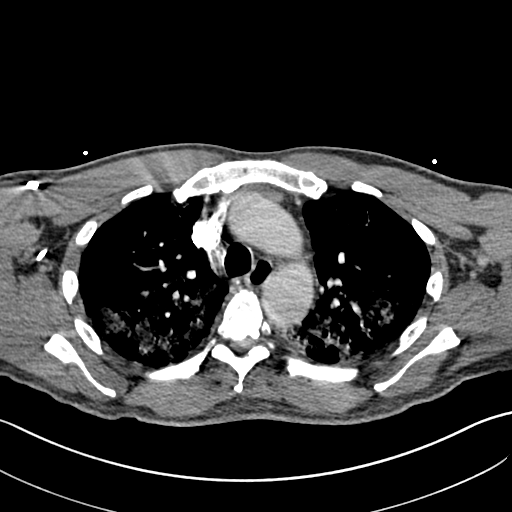
[im 223/285  lung]
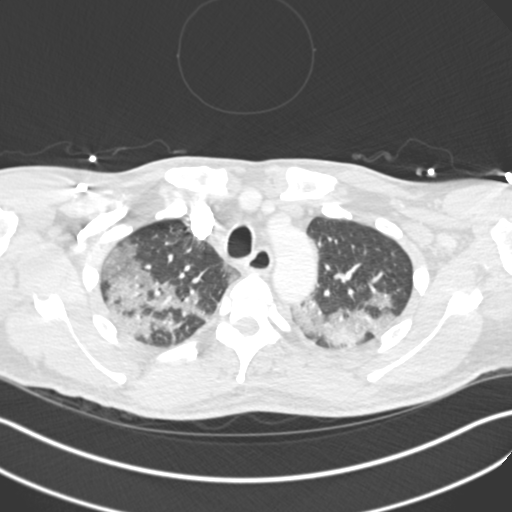
[im 235/285  soft-tissue]
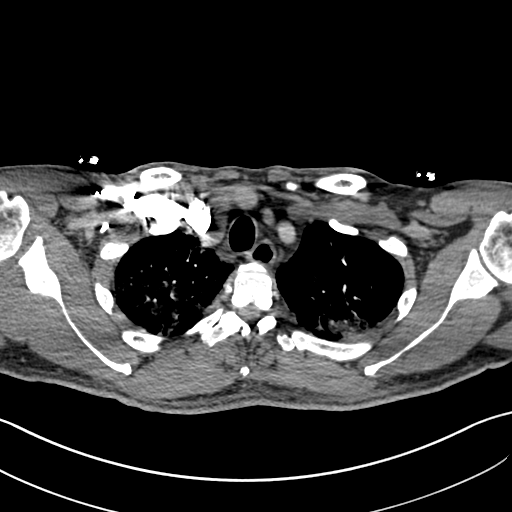
[im 247/285  lung]
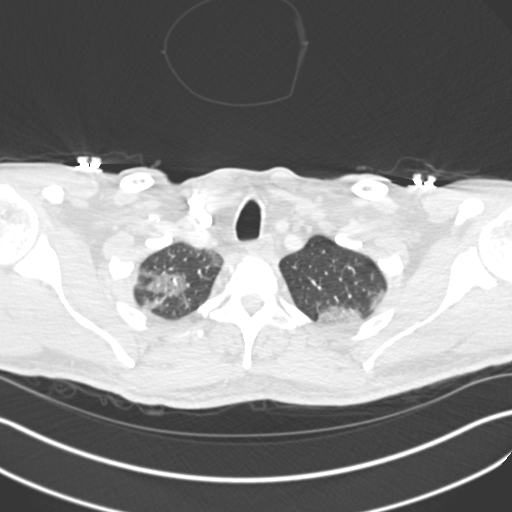
[im 272/285  soft-tissue]
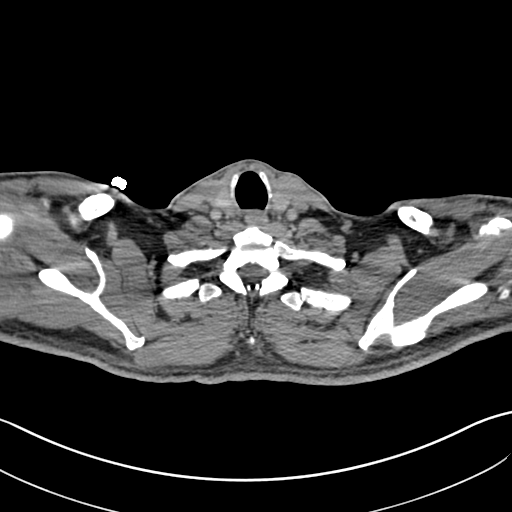

[Series 7: coronal mpr · coronal · 0.56mm/px · 2 of 74 slices shown]
[im 25/74  soft-tissue]
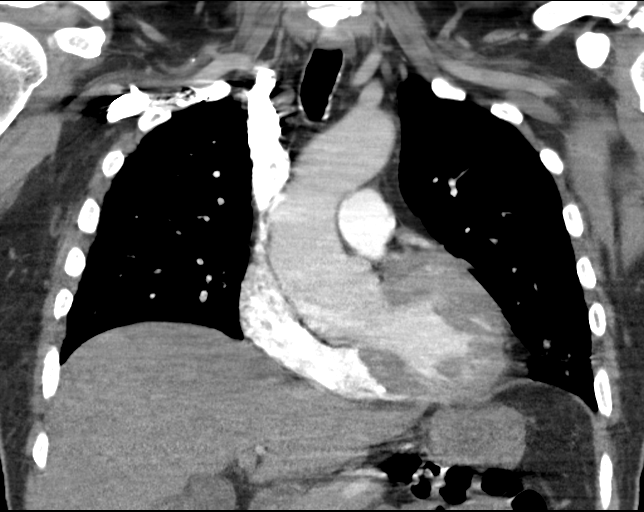
[im 49/74  soft-tissue]
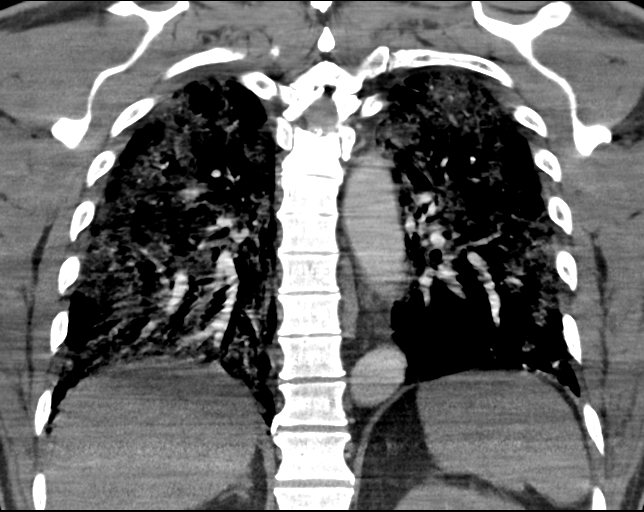

[18 of 46 positions shown; findings below may reference images not displayed]

FINDINGS: Cardiovascular: Heart is mildly enlarged. Mild aneurysmal dilatation
of the ascending thoracic aorta, 4.1 cm. No filling defects in the
pulmonary arteries to suggest pulmonary emboli.

Mediastinum/Nodes: No mediastinal, hilar, or axillary adenopathy.

Lungs/Pleura: Patchy ground-glass opacities throughout both lungs,
predominantly dependent but also noted in the apices. No effusions.
This likely related to [id number].

Upper Abdomen: Imaging into the upper abdomen shows no acute
findings.

Musculoskeletal: Chest wall soft tissues are unremarkable. No acute
bony abnormality.

Review of the MIP images confirms the above findings.
IMPRESSION: Extensive patchy bilateral ground-glass airspace opacities, likely
related to patient's known [id number].

No evidence of pulmonary embolus.

4.1 cm ascending thoracic aortic aneurysm. Recommend annual imaging
followup by CTA or MRA. This recommendation follows 4525
ACCF/AHA/AATS/ACR/ASA/SCA/AWA/ERXLEBEN/FAGARDO/MEACHAM Guidelines for the
Diagnosis and Management of Patients with Thoracic Aortic Disease.
Circulation. 4525; 121: [id number]. Aortic aneurysm NOS ([id number])
